# Patient Record
Sex: Female | Born: 1990 | Race: White | Hispanic: No | Marital: Married | Smoking: Current every day smoker
Health system: Southern US, Community
[De-identification: ages and names within clinical notes are randomized; demographics above are authoritative.]

## PROBLEM LIST (undated history)

## (undated) ENCOUNTER — Inpatient Hospital Stay: Payer: Self-pay

## (undated) DIAGNOSIS — Z789 Other specified health status: Secondary | ICD-10-CM

## (undated) DIAGNOSIS — T8859XA Other complications of anesthesia, initial encounter: Secondary | ICD-10-CM

## (undated) DIAGNOSIS — O039 Complete or unspecified spontaneous abortion without complication: Secondary | ICD-10-CM

## (undated) DIAGNOSIS — T4145XA Adverse effect of unspecified anesthetic, initial encounter: Secondary | ICD-10-CM

## (undated) DIAGNOSIS — R519 Headache, unspecified: Secondary | ICD-10-CM

## (undated) DIAGNOSIS — F329 Major depressive disorder, single episode, unspecified: Secondary | ICD-10-CM

## (undated) DIAGNOSIS — F32A Depression, unspecified: Secondary | ICD-10-CM

## (undated) DIAGNOSIS — F419 Anxiety disorder, unspecified: Secondary | ICD-10-CM

## (undated) DIAGNOSIS — E876 Hypokalemia: Secondary | ICD-10-CM

## (undated) DIAGNOSIS — Z9889 Other specified postprocedural states: Secondary | ICD-10-CM

## (undated) DIAGNOSIS — R112 Nausea with vomiting, unspecified: Secondary | ICD-10-CM

## (undated) DIAGNOSIS — R51 Headache: Secondary | ICD-10-CM

## (undated) HISTORY — PX: ABDOMINAL EXPLORATION SURGERY: SHX538

---
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
## 2009-12-18 ENCOUNTER — Ambulatory Visit: Payer: Self-pay | Admitting: Unknown Physician Specialty

## 2009-12-24 ENCOUNTER — Emergency Department (HOSPITAL_COMMUNITY): Admission: EM | Admit: 2009-12-24 | Discharge: 2009-12-24 | Payer: Self-pay | Admitting: Emergency Medicine

## 2009-12-29 ENCOUNTER — Ambulatory Visit: Payer: Self-pay | Admitting: Unknown Physician Specialty

## 2009-12-31 ENCOUNTER — Emergency Department (HOSPITAL_COMMUNITY): Admission: EM | Admit: 2009-12-31 | Discharge: 2009-12-31 | Payer: Self-pay | Admitting: Emergency Medicine

## 2010-06-07 LAB — URINE MICROSCOPIC-ADD ON

## 2010-06-07 LAB — COMPREHENSIVE METABOLIC PANEL
Alkaline Phosphatase: 45 U/L (ref 39–117)
BUN: 9 mg/dL (ref 6–23)
Chloride: 110 mEq/L (ref 96–112)
GFR calc non Af Amer: >60 mL/min (ref >60)
Glucose, Bld: 98 mg/dL (ref 70–99)
Potassium: 3.1 mEq/L — ABNORMAL LOW (ref 3.5–5.1)
Total Bilirubin: 0.4 mg/dL (ref 0.3–1.2)
Total Protein: 5.9 g/dL — ABNORMAL LOW (ref 6.0–8.3)

## 2010-06-07 LAB — DIFFERENTIAL
Basophils Absolute: 0 10*3/uL (ref 0.0–0.1)
Basophils Relative: 1 % (ref 0–1)
Neutro Abs: 3.4 10*3/uL (ref 1.7–7.7)
Neutrophils Relative %: 49 % (ref 43–77)

## 2010-06-07 LAB — CBC
HCT: 35.3 % — ABNORMAL LOW (ref 36.0–46.0)
MCV: 88 fL (ref 78.0–100.0)
RDW: 12.4 % (ref 11.5–15.5)
WBC: 6.9 10*3/uL (ref 4.0–10.5)

## 2010-06-07 LAB — URINALYSIS, ROUTINE W REFLEX MICROSCOPIC
Bilirubin Urine: NEGATIVE
Hgb urine dipstick: NEGATIVE
Nitrite: NEGATIVE
Protein, ur: 30 mg/dL — AB
Specific Gravity, Urine: 1.023 (ref 1.005–1.030)
Urobilinogen, UA: 0.2 mg/dL (ref 0.0–1.0)
Urobilinogen, UA: 0.2 mg/dL (ref 0.0–1.0)

## 2010-06-07 LAB — PREGNANCY, URINE: Preg Test, Ur: NEGATIVE

## 2010-06-07 LAB — LIPASE, BLOOD: Lipase: 21 U/L (ref 11–59)

## 2010-06-07 LAB — POCT PREGNANCY, URINE: Preg Test, Ur: NEGATIVE

## 2010-07-26 ENCOUNTER — Other Ambulatory Visit: Payer: Self-pay | Admitting: Obstetrics and Gynecology

## 2010-10-14 ENCOUNTER — Emergency Department: Payer: Self-pay | Admitting: Unknown Physician Specialty

## 2010-12-18 ENCOUNTER — Observation Stay: Payer: Self-pay

## 2010-12-20 ENCOUNTER — Emergency Department: Payer: Self-pay | Admitting: Emergency Medicine

## 2011-01-21 ENCOUNTER — Observation Stay: Payer: Self-pay | Admitting: Obstetrics and Gynecology

## 2011-02-27 ENCOUNTER — Observation Stay: Payer: Self-pay

## 2011-03-07 ENCOUNTER — Observation Stay: Payer: Self-pay

## 2011-03-22 ENCOUNTER — Observation Stay: Payer: Self-pay

## 2011-03-25 ENCOUNTER — Inpatient Hospital Stay: Payer: Self-pay | Admitting: Obstetrics and Gynecology

## 2011-11-18 ENCOUNTER — Emergency Department: Payer: Self-pay | Admitting: Emergency Medicine

## 2011-11-18 LAB — URINALYSIS, COMPLETE
Bilirubin,UR: NEGATIVE
Glucose,UR: NEGATIVE mg/dL (ref 0–75)
Ketone: NEGATIVE
Ph: 5 (ref 4.5–8.0)
Specific Gravity: 1.03 (ref 1.003–1.030)
Squamous Epithelial: 3

## 2012-01-28 ENCOUNTER — Emergency Department: Payer: Self-pay | Admitting: Emergency Medicine

## 2013-03-24 ENCOUNTER — Emergency Department: Payer: Self-pay | Admitting: Emergency Medicine

## 2013-03-24 LAB — RAPID INFLUENZA A&B ANTIGENS

## 2013-04-19 DIAGNOSIS — F419 Anxiety disorder, unspecified: Secondary | ICD-10-CM | POA: Insufficient documentation

## 2013-04-19 DIAGNOSIS — Z8659 Personal history of other mental and behavioral disorders: Secondary | ICD-10-CM | POA: Insufficient documentation

## 2013-05-04 ENCOUNTER — Ambulatory Visit: Payer: Self-pay | Admitting: Family Medicine

## 2014-07-24 ENCOUNTER — Emergency Department
Admission: EM | Admit: 2014-07-24 | Discharge: 2014-07-24 | Disposition: A | Discharge status: Home / Self Care | Payer: Self-pay | Attending: Emergency Medicine | Admitting: Emergency Medicine

## 2014-07-24 ENCOUNTER — Encounter: Payer: Self-pay | Admitting: Emergency Medicine

## 2014-07-24 DIAGNOSIS — O99330 Smoking (tobacco) complicating pregnancy, unspecified trimester: Secondary | ICD-10-CM | POA: Insufficient documentation

## 2014-07-24 DIAGNOSIS — F17210 Nicotine dependence, cigarettes, uncomplicated: Secondary | ICD-10-CM | POA: Insufficient documentation

## 2014-07-24 DIAGNOSIS — Z3A00 Weeks of gestation of pregnancy not specified: Secondary | ICD-10-CM | POA: Insufficient documentation

## 2014-07-24 DIAGNOSIS — O039 Complete or unspecified spontaneous abortion without complication: Secondary | ICD-10-CM | POA: Insufficient documentation

## 2014-07-24 LAB — CBC
HEMATOCRIT: 42.6 % (ref 35.0–47.0)
HEMOGLOBIN: 14.2 g/dL (ref 12.0–16.0)
MCH: 29.8 pg (ref 26.0–34.0)
MCHC: 33.4 g/dL (ref 32.0–36.0)
MCV: 89.1 fL (ref 80.0–100.0)
Platelets: 197 10*3/uL (ref 150–440)
RBC: 4.77 MIL/uL (ref 3.80–5.20)
RDW: 13.6 % (ref 11.5–14.5)
WBC: 7.7 10*3/uL (ref 3.6–11.0)

## 2014-07-24 LAB — URINALYSIS COMPLETE WITH MICROSCOPIC (ARMC ONLY)
Bacteria, UA: NONE SEEN
Bilirubin Urine: NEGATIVE
Glucose, UA: NEGATIVE mg/dL
KETONES UR: NEGATIVE mg/dL
Leukocytes, UA: NEGATIVE
NITRITE: NEGATIVE
PH: 6 (ref 5.0–8.0)
PROTEIN: 30 mg/dL — AB
Specific Gravity, Urine: 1.02 (ref 1.005–1.030)

## 2014-07-24 LAB — HCG, QUANTITATIVE, PREGNANCY: HCG, BETA CHAIN, QUANT, S: 5 m[IU]/mL — AB (ref <5)

## 2014-07-24 NOTE — ED Provider Notes (Signed)
[facility] Emergency Department Provider Note    ____________________________________________  Time seen: 11:40 AM  I have reviewed the triage vital signs and the nursing notes.   HISTORY  Chief Complaint Vaginal Bleeding       HPI Erin Ellis is a 24 y.o. female who presents with vaginal bleeding 12 hours. She noted small amount of blood after urinating yesterday evening. This morning there was more bleeding. She did take a Percocet test which was +1 week ago. Patient is a G4 P2. Does note mild pelvic cramping. Has not taken any medications for this. Nothing appears to make it better or worse. She reports a short period at the end of April and her last normal period was early March.     History reviewed. No pertinent past medical history.  There are no active problems to display for this patient.   Past Surgical History  Procedure Laterality Date  . Abdominal exploration surgery      No current outpatient prescriptions on file.  Allergies Sulfa antibiotics  No family history on file.  Social History History  Substance Use Topics  . Smoking status: Current Every Day Smoker -- 1.00 packs/day    Types: Cigarettes  . Smokeless tobacco: Not on file  . Alcohol Use: Yes    Review of Systems  Constitutional: Negative for fever. Eyes: Negative for visual changes. ENT: Negative for sore throat. Cardiovascular: Negative for chest pain. Respiratory: Negative for shortness of breath. Gastrointestinal: Negative for abdominal pain, vomiting and diarrhea. Genitourinary: Negative for dysuria. Positive for vaginal bleeding and pelvic cramping Musculoskeletal: Negative for back pain. Skin: Negative for rash. Neurological: Negative for headaches, focal weakness or numbness.   10-point ROS otherwise negative.  ____________________________________________   PHYSICAL EXAM:  VITAL SIGNS: ED Triage Vitals  Enc Vitals Group     BP  07/24/14 1123 119/69 mmHg     Pulse Rate 07/24/14 1123 86     Resp 07/24/14 1123 14     Temp 07/24/14 1123 98 F (36.7 C)     Temp Source 07/24/14 1123 Oral     SpO2 07/24/14 1123 100 %     Weight 07/24/14 1123 125 lb (56.7 kg)     Height 07/24/14 1123 5\' 3"  (1.6 m)     Head Cir --      Peak Flow --      Pain Score 07/24/14 1123 9     Pain Loc --      Pain Edu? --      Excl. in GC? --      Constitutional: Alert and oriented. Well appearing and in no distress. Eyes: Conjunctivae are normal. PERRL. Normal extraocular movements. ENT   Head: Normocephalic and atraumatic.   Nose: No congestion/rhinnorhea.   Mouth/Throat: Mucous membranes are moist.   Neck: No stridor. Hematological/Lymphatic/Immunilogical: No cervical lymphadenopathy. Cardiovascular: Normal rate, regular rhythm. Normal and symmetric distal pulses are present in all extremities. No murmurs, rubs, or gallops. Respiratory: Normal respiratory effort without tachypnea nor retractions. Breath sounds are clear and equal bilaterally. No wheezes/rales/rhonchi. Gastrointestinal: Patient does have mild right lower quadrant/pelvic tenderness to palpation. No distention. No abdominal bruits. There is no CVA tenderness. Genitourinary: Deferred Musculoskeletal: Nontender with normal range of motion in all extremities. No joint effusions.  No lower extremity tenderness nor edema. Neurologic:  Normal speech and language. No gross focal neurologic deficits are appreciated. Speech is normal. No gait instability. Skin:  Skin is warm, dry and intact. No rash noted.  Psychiatric: Mood and affect are normal. Speech and behavior are normal. Patient exhibits appropriate insight and judgment.  ____________________________________________   EKG  None  ____________________________________________    RADIOLOGY  None  ____________________________________________   PROCEDURES  Procedure(s) performed: None  Critical  Care performed: No  ____________________________________________   INITIAL IMPRESSION / ASSESSMENT AND PLAN / ED COURSE  Pertinent labs & imaging results that were available during my care of the patient were reviewed by me and considered in my medical decision making (see chart for details).  ----------------------------------------- 11:48 AM on 07/24/2014 -----------------------------------------  Patient reports positive home pregnancy test now with vaginal bleeding she is concerned about a miscarriage. Labs beta hCG sent. Depending on labs plan is for likely ultrasound.  Beta hCG 5. Not consistent with pregnancy. Explained to patient results asked her to follow-up with GYN. On reexam no tenderness to palpation of the abdomen  ____________________________________________   FINAL CLINICAL IMPRESSION(S) / ED DIAGNOSES  Final diagnoses:  Miscarriage     Jene Every, MD 07/25/14 [phone]

## 2014-07-24 NOTE — Discharge Instructions (Signed)
Miscarriage °A miscarriage is the loss of an unborn baby (fetus) before the 20th week of pregnancy. The cause is often unknown.  °HOME CARE °· You may need to stay in bed (bed rest), or you may be able to do light activity. Go about activity as told by your doctor. °· Have help at home. °· Write down how many pads you use each day. Write down how soaked they are. °· Do not use tampons. Do not wash out your vagina (douche) or have sex (intercourse) until your doctor approves. °· Only take medicine as told by your doctor. °· Do not take aspirin. °· Keep all doctor visits as told. °· If you or your partner have problems with grieving, talk to your doctor. You can also try counseling. Give yourself time to grieve before trying to get pregnant again. °GET HELP RIGHT AWAY IF: °· You have bad cramps or pain in your back or belly (abdomen). °· You have a fever. °· You pass large clumps of blood (clots) from your vagina that are walnut-sized or larger. Save the clumps for your doctor to see. °· You pass large amounts of tissue from your vagina. Save the tissue for your doctor to see. °· You have more bleeding. °· You have thick, bad-smelling fluid (discharge) coming from the vagina. °· You get lightheaded, weak, or you pass out (faint). °· You have chills. °MAKE SURE YOU: °· Understand these instructions. °· Will watch your condition. °· Will get help right away if you are not doing well or get worse. °Document Released: 06/03/2011 Document Reviewed: 06/03/2011 °[employer]® Patient Information ©2015 [employer]. This information is not intended to replace advice given to you by your health care provider. Make sure you discuss any questions you have with your health care provider. ° °

## 2014-07-24 NOTE — ED Notes (Signed)
Patient states that she had a positive pregnancy test on the 28th. Patient states that her [facility] was April but it was shorter then normal. March menstrual cycle was normal. Report vaginal bleeding and abdominal cramping since last night.

## 2014-11-01 DIAGNOSIS — F419 Anxiety disorder, unspecified: Secondary | ICD-10-CM

## 2014-11-01 DIAGNOSIS — Z720 Tobacco use: Secondary | ICD-10-CM | POA: Insufficient documentation

## 2014-11-01 DIAGNOSIS — F329 Major depressive disorder, single episode, unspecified: Secondary | ICD-10-CM | POA: Insufficient documentation

## 2014-11-01 DIAGNOSIS — J45909 Unspecified asthma, uncomplicated: Secondary | ICD-10-CM | POA: Insufficient documentation

## 2014-11-01 DIAGNOSIS — F32A Depression, unspecified: Secondary | ICD-10-CM | POA: Insufficient documentation

## 2014-11-01 DIAGNOSIS — Z3491 Encounter for supervision of normal pregnancy, unspecified, first trimester: Secondary | ICD-10-CM | POA: Insufficient documentation

## 2014-11-05 ENCOUNTER — Emergency Department: Payer: Medicaid Other

## 2014-11-05 ENCOUNTER — Emergency Department
Admission: EM | Admit: 2014-11-05 | Discharge: 2014-11-05 | Disposition: A | Discharge status: Home / Self Care | Payer: Medicaid Other | Attending: Emergency Medicine | Admitting: Emergency Medicine

## 2014-11-05 ENCOUNTER — Encounter: Payer: Self-pay | Admitting: Emergency Medicine

## 2014-11-05 DIAGNOSIS — O209 Hemorrhage in early pregnancy, unspecified: Secondary | ICD-10-CM | POA: Insufficient documentation

## 2014-11-05 DIAGNOSIS — O99331 Smoking (tobacco) complicating pregnancy, first trimester: Secondary | ICD-10-CM | POA: Diagnosis not present

## 2014-11-05 DIAGNOSIS — Z79899 Other long term (current) drug therapy: Secondary | ICD-10-CM | POA: Insufficient documentation

## 2014-11-05 DIAGNOSIS — F17210 Nicotine dependence, cigarettes, uncomplicated: Secondary | ICD-10-CM | POA: Diagnosis not present

## 2014-11-05 DIAGNOSIS — Z3A08 8 weeks gestation of pregnancy: Secondary | ICD-10-CM | POA: Insufficient documentation

## 2014-11-05 HISTORY — DX: Complete or unspecified spontaneous abortion without complication: O03.9

## 2014-11-05 LAB — ABO/RH: ABO/RH(D): A POS

## 2014-11-05 LAB — CBC
HEMATOCRIT: 40.8 % (ref 35.0–47.0)
Hemoglobin: 13.8 g/dL (ref 12.0–16.0)
MCH: 30.3 pg (ref 26.0–34.0)
MCHC: 33.8 g/dL (ref 32.0–36.0)
MCV: 89.6 fL (ref 80.0–100.0)
PLATELETS: 206 10*3/uL (ref 150–440)
RBC: 4.55 MIL/uL (ref 3.80–5.20)
RDW: 13.7 % (ref 11.5–14.5)
WBC: 7 10*3/uL (ref 3.6–11.0)

## 2014-11-05 LAB — HCG, QUANTITATIVE, PREGNANCY: hCG, Beta Chain, Quant, S: 9886 m[IU]/mL — ABNORMAL HIGH (ref <5)

## 2014-11-05 NOTE — ED Provider Notes (Signed)
[facility] Emergency Department Provider Note  ____________________________________________  Time seen: Approximately 2:54 PM  I have reviewed the triage vital signs and the nursing notes.   HISTORY  Chief Complaint Vaginal Bleeding    HPI Erin Ellis is a 24 y.o. female G5 to previous full-term deliveries, one miscarriage and May, and 1 abortion last year. Presents today with approximately [redacted] weeks pregnant per her report and follow-up with the health department. She states that this morning she got up and noticed that she was having a slight amount of painless vaginal bleeding. She denies any pain or other symptoms. No fevers or chills. She states that she has a pad on down is having a slight amount of discharge. She is concerned that she may be having another miscarriage.  She does not use any fertility treatments. She feels well. No previous history of ectopic. This is an intended pregnancy.   Past Medical History  Diagnosis Date  . Miscarriage   . Abortion     In 2015 (8 weeks, 4 days pregnant)    There are no active problems to display for this patient.   Past Surgical History  Procedure Laterality Date  . Abdominal exploration surgery      Current Outpatient Rx  Name  Route  Sig  Dispense  Refill  . Prenatal Vit-Fe Fumarate-FA (PRENATAL MULTIVITAMIN) TABS tablet   Oral   Take 1 tablet by mouth daily at 12 noon.           Allergies Sulfa antibiotics  No family history on file.  Social History Social History  Substance Use Topics  . Smoking status: Current Every Day Smoker -- 1.00 packs/day    Types: Cigarettes  . Smokeless tobacco: None  . Alcohol Use: No    Review of Systems Constitutional: No fever/chills Eyes: No visual changes. ENT: No sore throat. Cardiovascular: Denies chest pain. Respiratory: Denies shortness of breath. Gastrointestinal: No abdominal pain.  No nausea, no vomiting.  No diarrhea.  No  constipation. Genitourinary: Negative for dysuria. Slight vaginal bleeding today. No passage of clots. No other discharge. No vaginal pain, no white discharge. Musculoskeletal: Negative for back pain. Skin: Negative for rash. Neurological: Negative for headaches, focal weakness or numbness.  10-point ROS otherwise negative.  ____________________________________________   PHYSICAL EXAM:  VITAL SIGNS: ED Triage Vitals  Enc Vitals Group     BP 11/05/14 1312 119/73 mmHg     Pulse Rate 11/05/14 1312 81     Resp 11/05/14 1312 18     Temp 11/05/14 1312 97.8 F (36.6 C)     Temp src --      SpO2 11/05/14 1312 98 %     Weight 11/05/14 1312 125 lb (56.7 kg)     Height 11/05/14 1312  (1.6 m)     Head Cir --      Peak Flow --      Pain Score --      Pain Loc --      Pain Edu? --      Excl. in GC? --     Constitutional: Alert and oriented. Well appearing and in no acute distress. Eyes: Conjunctivae are normal. PERRL. EOMI. Head: Atraumatic. Nose: No congestion/rhinnorhea. Mouth/Throat: Mucous membranes are moist.  Oropharynx non-erythematous. Neck: No stridor.   Cardiovascular: Normal rate, regular rhythm. Grossly normal heart sounds.  Good peripheral circulation. Respiratory: Normal respiratory effort.  No retractions. Lungs CTAB. Gastrointestinal: Soft and nontender. No distention. No abdominal bruits.  No CVA tenderness. No tenderness in the lower abdomen. Musculoskeletal: No lower extremity tenderness nor edema.  No joint effusions. Neurologic:  Normal speech and language. No gross focal neurologic deficits are appreciated. No gait instability. Skin:  Skin is warm, dry and intact. No rash noted. Psychiatric: Mood and affect are normal. Speech and behavior are normal.  ____________________________________________   LABS (all labs ordered are listed, but only abnormal results are displayed)  Labs Reviewed  HCG, QUANTITATIVE, PREGNANCY - Abnormal; Notable for the  following:    hCG, Beta Chain, Quant, S 9886 (*)    All other components within normal limits  CBC  ABO/RH   ____________________________________________  EKG   ____________________________________________  RADIOLOGY  IMPRESSION: IUP with yolk sac and fetal pole, but no cardiac activity yet visualized.  Findings are suspicious but not yet definitive for failed pregnancy. Recommend follow-up US in 10-14 days for definitive diagnosis. This recommendation follows SRU consensus guidelines: Diagnostic Criteria for Nonviable Pregnancy Early in the First Trimester. Malva Limes Med 2013; 369:1443-51. ____________________________________________   PROCEDURES  Procedure(s) performed: None  Critical Care performed: No  ____________________________________________   INITIAL IMPRESSION / ASSESSMENT AND PLAN / ED COURSE  Pertinent labs & imaging results that were available during my care of the patient were reviewed by me and considered in my medical decision making (see chart for details).  Painless vaginal bleeding during the first trimester. Patient report. Vital signs stable no distress. Based on her clinical presentation of very unlikely she has neck top pregnancy, given her history of previous miscarriage and today's bleeding which does not appear to be significant/major we will obtain ultrasound as well as type and screen with CBC. Rule out ectopic and eval for 1st trimester bleeding.  ----------------------------------------- 6:11 PM on 11/05/2014 -----------------------------------------  Patient is well in no pain. I discussed her ultrasound findings and they we are unable to know at this time whether or not she is caring a healthy pregnancy. She is understanding of this, and she has set up follow-up with [facility] obstetrics already this month, but will follow-up this week. Reviewed history, U/S and follow-up recs for this week with Dr. Arvella Merles of OB who is agreeabel to  see patient in f/u in clinic this week.  Return precautions advised. ____________________________________________   FINAL CLINICAL IMPRESSION(S) / ED DIAGNOSES  Final diagnoses:  Vaginal bleeding in pregnancy, first trimester      Sharyn Creamer, MD 11/05/14 [phone]

## 2014-11-05 NOTE — Discharge Instructions (Signed)
Vaginal Bleeding During Pregnancy, First Trimester  Please follow-up with [facility] obstetrics this week. They will be able to work with you to assist with financial aid as we discussed. Please return to the emergency room right away should you develop severe pain, heavy bleeding, weakness, feel lightheaded, develop a fever, or other new concerns arise.  A small amount of bleeding (spotting) from the vagina is relatively common in early pregnancy. It usually stops on its own. Various things may cause bleeding or spotting in early pregnancy. Some bleeding may be related to the pregnancy, and some may not. In most cases, the bleeding is normal and is not a problem. However, bleeding can also be a sign of something serious. Be sure to tell your health care provider about any vaginal bleeding right away. Some possible causes of vaginal bleeding during the first trimester include:  Infection or inflammation of the cervix.  Growths (polyps) on the cervix.  Miscarriage or threatened miscarriage.  Pregnancy tissue has developed outside of the uterus and in a fallopian tube (tubal pregnancy).  Tiny cysts have developed in the uterus instead of pregnancy tissue (molar pregnancy). HOME CARE INSTRUCTIONS  Watch your condition for any changes. The following actions may help to lessen any discomfort you are feeling:  Follow your health care provider's instructions for limiting your activity. If your health care provider orders bed rest, you may need to stay in bed and only get up to use the bathroom. However, your health care provider may allow you to continue light activity.  If needed, make plans for someone to help with your regular activities and responsibilities while you are on bed rest.  Keep track of the number of pads you use each day, how often you change pads, and how soaked (saturated) they are. Write this down.  Do not use tampons. Do not douche.  Do not have sexual intercourse or orgasms  until approved by your health care provider.  If you pass any tissue from your vagina, save the tissue so you can show it to your health care provider.  Only take over-the-counter or prescription medicines as directed by your health care provider.  Do not take aspirin because it can make you bleed.  Keep all follow-up appointments as directed by your health care provider. SEEK MEDICAL CARE IF:  You have any vaginal bleeding during any part of your pregnancy.  You have cramps or labor pains.  You have a fever, not controlled by medicine. SEEK IMMEDIATE MEDICAL CARE IF:   You have severe cramps in your back or belly (abdomen).  You pass large clots or tissue from your vagina.  Your bleeding increases.  You feel light-headed or weak, or you have fainting episodes.  You have chills.  You are leaking fluid or have a gush of fluid from your vagina.  You pass out while having a bowel movement. MAKE SURE YOU:  Understand these instructions.  Will watch your condition.  Will get help right away if you are not doing well or get worse. Document Released: 12/19/2004 Document Revised: 03/16/2013 Document Reviewed: 11/16/2012 [facility] Patient Information 2015 [place], [place]. This information is not intended to replace advice given to you by your health care provider. Make sure you discuss any questions you have with your health care provider.

## 2014-11-05 NOTE — ED Notes (Addendum)
Patient to ER for c/o vaginal bleeding that started today. Patient states she had red blood, but not bright red. Moderate amount of bleeding. Had miscarriage in April. Last period was in June, but has irregular periods. Scheduled at [facility] for dating ultrasound on 11/17/14 Deatra Robinson, midwife is who patient is scheduled to see).

## 2014-11-05 NOTE — ED Notes (Signed)
MD aware of pain rating.

## 2015-02-23 ENCOUNTER — Emergency Department
Admission: EM | Admit: 2015-02-23 | Discharge: 2015-02-23 | Disposition: A | Discharge status: Home / Self Care | Payer: Medicaid Other | Attending: Emergency Medicine | Admitting: Emergency Medicine

## 2015-02-23 DIAGNOSIS — N939 Abnormal uterine and vaginal bleeding, unspecified: Secondary | ICD-10-CM | POA: Diagnosis not present

## 2015-02-23 DIAGNOSIS — Z3202 Encounter for pregnancy test, result negative: Secondary | ICD-10-CM | POA: Insufficient documentation

## 2015-02-23 DIAGNOSIS — Z79899 Other long term (current) drug therapy: Secondary | ICD-10-CM | POA: Diagnosis not present

## 2015-02-23 DIAGNOSIS — F17210 Nicotine dependence, cigarettes, uncomplicated: Secondary | ICD-10-CM | POA: Diagnosis not present

## 2015-02-23 LAB — URINALYSIS COMPLETE WITH MICROSCOPIC (ARMC ONLY)
BACTERIA UA: NONE SEEN
Bilirubin Urine: NEGATIVE
Glucose, UA: NEGATIVE mg/dL
LEUKOCYTES UA: NEGATIVE
NITRITE: NEGATIVE
PH: 6 (ref 5.0–8.0)
Protein, ur: NEGATIVE mg/dL
SPECIFIC GRAVITY, URINE: 1.027 (ref 1.005–1.030)

## 2015-02-23 LAB — BASIC METABOLIC PANEL
ANION GAP: 6 (ref 5–15)
BUN: 11 mg/dL (ref 6–20)
CHLORIDE: 107 mmol/L (ref 101–111)
CO2: 27 mmol/L (ref 22–32)
Calcium: 9.2 mg/dL (ref 8.9–10.3)
Creatinine, Ser: 0.61 mg/dL (ref 0.44–1.00)
GFR calc non Af Amer: >60 mL/min (ref >60)
GLUCOSE: 105 mg/dL — AB (ref 65–99)
POTASSIUM: 3.5 mmol/L (ref 3.5–5.1)
Sodium: 140 mmol/L (ref 135–145)

## 2015-02-23 LAB — CBC
HEMATOCRIT: 40.3 % (ref 35.0–47.0)
HEMOGLOBIN: 13.8 g/dL (ref 12.0–16.0)
MCH: 30.6 pg (ref 26.0–34.0)
MCHC: 34.2 g/dL (ref 32.0–36.0)
MCV: 89.4 fL (ref 80.0–100.0)
Platelets: 223 10*3/uL (ref 150–440)
RBC: 4.5 MIL/uL (ref 3.80–5.20)
RDW: 13.7 % (ref 11.5–14.5)
WBC: 8.9 10*3/uL (ref 3.6–11.0)

## 2015-02-23 LAB — HCG, QUANTITATIVE, PREGNANCY: hCG, Beta Chain, Quant, S: 1 m[IU]/mL (ref <5)

## 2015-02-23 NOTE — ED Notes (Signed)
Pt in with vaginal bleeding for 4 days, had positive pregnancy test last week.  States 5 miscarriages in the last year.

## 2015-02-23 NOTE — Discharge Instructions (Signed)
As we discussed, does not appear from your blood test that you're pregnant today but no previous home pregnancy tests are questionably positive.  I advise you to follow up closely with [facility] doctors within the next 1-2 weeks for follow-up. Return to the emergency room right away if he develop a fever, pain, heavy bleeding, lightheadedness, or other new concerns arise.

## 2015-02-23 NOTE — ED Provider Notes (Signed)
[facility] Emergency Department Provider Note REMINDER - THIS NOTE IS NOT A FINAL MEDICAL RECORD UNTIL IT IS SIGNED. UNTIL THEN, THE CONTENT BELOW MAY REFLECT INFORMATION FROM A DOCUMENTATION TEMPLATE, NOT THE ACTUAL PATIENT VISIT. ____________________________________________  Time seen: Approximately 10:11 PM  I have reviewed the triage vital signs and the nursing notes.   HISTORY  Chief Complaint Vaginal Bleeding    HPI Erin Ellis is a 24 y.o. female comes for evaluation of vaginal bleeding which she describes as spotting like a period for the last 4 days. She reports that in the last year she has had about 5 miscarriages, she also had a previous abortion about 3 years ago, as well as has 2 healthy children  She reports that she has not had any pain, fever, nausea vomiting or other concerns. She took a home pregnancy test about 2 weeks ago and demonstrates via picture what appears to be very light and potentially positive home pregnancy test.  Patient tells me that she feels as though she started to have her period, that she would like further evaluation to see why she keeps having positive pregnancy test.   Past Medical History  Diagnosis Date  . Miscarriage   . Abortion     In 2015 (8 weeks, 4 days pregnant)    There are no active problems to display for this patient.   Past Surgical History  Procedure Laterality Date  . Abdominal exploration surgery      Current Outpatient Rx  Name  Route  Sig  Dispense  Refill  . Prenatal Vit-Fe Fumarate-FA (PRENATAL MULTIVITAMIN) TABS tablet   Oral   Take 1 tablet by mouth daily at 12 noon.           Allergies Sulfa antibiotics  No family history on file.  Social History Social History  Substance Use Topics  . Smoking status: Current Every Day Smoker -- 1.00 packs/day    Types: Cigarettes  . Smokeless tobacco: Not on file  . Alcohol Use: No    Review of Systems Constitutional: No  fever/chills Eyes: No visual changes. ENT: No sore throat. Cardiovascular: Denies chest pain. Respiratory: Denies shortness of breath. Gastrointestinal: No abdominal pain.  No nausea, no vomiting.  No diarrhea.  No constipation. Genitourinary: Negative for dysuria. Musculoskeletal: Negative for back pain. Skin: Negative for rash. Neurological: Negative for headaches, focal weakness or numbness.  10-point ROS otherwise negative.  ____________________________________________   PHYSICAL EXAM:  VITAL SIGNS: ED Triage Vitals  Enc Vitals Group     BP 02/23/15 2025 121/71 mmHg     Pulse Rate 02/23/15 2025 80     Resp 02/23/15 2025 18     Temp 02/23/15 2025 98.3 F (36.8 C)     Temp Source 02/23/15 2025 Oral     SpO2 02/23/15 2025 99 %     Weight 02/23/15 2025 130 lb (58.968 kg)     Height 02/23/15 2025  (1.6 m)     Head Cir --      Peak Flow --      Pain Score --      Pain Loc --      Pain Edu? --      Excl. in GC? --    Constitutional: Alert and oriented. Well appearing and in no acute distress. Eyes: Conjunctivae are normal. PERRL. EOMI. Head: Atraumatic. Nose: No congestion/rhinnorhea. Mouth/Throat: Mucous membranes are moist.  Oropharynx non-erythematous. Neck: No stridor.   Cardiovascular: Normal rate, regular  rhythm. Grossly normal heart sounds.  Good peripheral circulation. Respiratory: Normal respiratory effort.  No retractions. Lungs CTAB. Gastrointestinal: Soft and nontender. No distention. No abdominal bruits. No CVA tenderness. Musculoskeletal: No lower extremity tenderness nor edema.  No joint effusions. Neurologic:  Normal speech and language. No gross focal neurologic deficits are appreciated. No gait instability. Skin:  Skin is warm, dry and intact. No rash noted. Psychiatric: Mood and affect are normal. Speech and behavior are normal.  ____________________________________________   LABS (all labs ordered are listed, but only abnormal results are  displayed)  Labs Reviewed  BASIC METABOLIC PANEL - Abnormal; Notable for the following:    Glucose, Bld 105 (*)    All other components within normal limits  URINALYSIS COMPLETEWITH MICROSCOPIC ([facility] ONLY) - Abnormal; Notable for the following:    Color, Urine YELLOW (*)    APPearance CLEAR (*)    Ketones, ur TRACE (*)    Hgb urine dipstick 1+ (*)    Squamous Epithelial / LPF 0-5 (*)    All other components within normal limits  HCG, QUANTITATIVE, PREGNANCY  CBC   ____________________________________________  EKG   ____________________________________________  RADIOLOGY   ____________________________________________   PROCEDURES  Procedure(s) performed: None  Critical Care performed: No  ____________________________________________   INITIAL IMPRESSION / ASSESSMENT AND PLAN / ED COURSE  Pertinent labs & imaging results that were available during my care of the patient were reviewed by me and considered in my medical decision making (see chart for details).  Patient presents for evaluation of vaginal bleeding, poorly consistent with approximately a normal period. The present time, she is very reassuring exam with no pain. She does have a negative hCG of less than 5. This would appear to be inconsistent with an obvious pregnancy.  She is stable, no acute concerns. Her last menstrual. She reported to me was October 30, and I question if this may just be a normal cycle. She does demonstrate what look like it questionably positive home pregnancy test, though pretty unclear and is certainly not clearly diagnostic from 2 weeks ago.  We discussed and I advised and gave her referral for follow-up with OB/GYN. She is agreeable with this plan, and plans to establish care with them within the next 1 week. Return precautions advised. ____________________________________________   FINAL CLINICAL IMPRESSION(S) / ED DIAGNOSES  Final diagnoses:  Vaginal spotting      Sharyn Creamer, MD 02/23/15 2314

## 2015-03-26 NOTE — L&D Delivery Note (Signed)
Delivery Note  First Stage: Induction of labor: Cervidil placed on 12/05/15 @ 2130 Labor onset: 12/06/15: 1227 Augmentation : AROM, Pitocin Analgesia Eliezer Lofts intrapartum: Epidural  AROM at 1227 - clear fluid  Second Stage: Complete dilation at 1648 Onset of pushing at 1648 FHR second stage: Baseline: 145 bpm/ moderate variability/ +accels/ no decels    Delivery of a viable Female "Belle" at 1701 by Carlean Jews, CNM in [facility] position with compound hand presentation No nuchal cord Cord double clamped after cessation of pulsation, cut by FOB Cord blood sample N/A  Third Stage: Placenta delivered via Tomasa Blase intact with 3 VC @ 1709 Placenta disposition: hospital disposal  Uterine tone firm with massage / bleeding minimal  No lacerations identified  Est. Blood Loss (mL):  Complications: none  Mom to postpartum.  Baby to Couplet care / Skin to Skin.  Newborn: Birth Weight: 2960 grams (6#8.4oz) Apgar Scores: 8, 9 Feeding planned: Breast  Carlean Jews, CNM

## 2015-04-17 ENCOUNTER — Ambulatory Visit
Admission: EM | Admit: 2015-04-17 | Discharge: 2015-04-17 | Disposition: A | Discharge status: Home / Self Care | Payer: Self-pay | Attending: Family Medicine | Admitting: Family Medicine

## 2015-04-17 DIAGNOSIS — Z3201 Encounter for pregnancy test, result positive: Secondary | ICD-10-CM

## 2015-04-17 DIAGNOSIS — L03213 Periorbital cellulitis: Secondary | ICD-10-CM

## 2015-04-17 DIAGNOSIS — Z3401 Encounter for supervision of normal first pregnancy, first trimester: Secondary | ICD-10-CM

## 2015-04-17 LAB — PREGNANCY, URINE: PREG TEST UR: POSITIVE — AB

## 2015-04-17 NOTE — ED Provider Notes (Signed)
[facility]  ____________________________________________  Time seen: Approximately 1:29 PM  I have reviewed the triage vital signs and the nursing notes.   HISTORY  Chief Complaint Eye Problem   HPI Erin Ellis is a 25 y.o. female   presents for the complaints of redness and swelling around left eye. Patient reports that she noticed a small what appeared to be a knot beside her left eyebrow this past week and states that she's had gradual onset of redness. Patient denies drainage from the knot but reports that knot resolved and now has a scabbed area in that same area. She reports that the eye itself does not hurt but reports that she has pain with looking to her left, looking down as well as mild pain looking up and looking to her right. Denies trauma foreign body sensation. Denies sick contacts. Denies injury, bites or break in skin. Denies chemical exposure. Patient reports some light sensitivity to left eye today. Denies vision changes. Reports that she does not wear glasses or contacts on a regular basis but reports that she knows she needs glasses. States left eye was more swollen this am than current.   Patient also reports that she is 3 days late for her menstrual cycle. Patient states that she has been trying to become pregnant. Denies abdominal pain, vaginal bleeding, vaginal discharge, back pain, nausea, vomiting, dizziness or other complaints. Patient reports last menstrual December 25. Reports OB/GYN is [facility].   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    Past Medical History  Diagnosis Date  . Miscarriage   . Abortion     In 2015 (8 weeks, 4 days pregnant)    There are no active problems to display for this patient.   Past Surgical History  Procedure Laterality Date  . Abdominal exploration surgery      Current Outpatient Rx  Name  Route  Sig  Dispense  Refill  .  Prenatal Vit-Fe Fumarate-FA (PRENATAL MULTIVITAMIN) TABS tablet   Oral   Take 1 tablet by mouth daily at 12 noon.           Allergies Sulfa antibiotics  Family History  Problem Relation Age of Onset  . Hypertension Mother     Social History Social History  Substance Use Topics  . Smoking status: Current Every Day Smoker -- 1.00 packs/day    Types: Cigarettes  . Smokeless tobacco: None  . Alcohol Use: No    Review of Systems Constitutional: No fever/chills Eyes: No visual changes. Redness around left eye.  ENT: No sore throat. Cardiovascular: Denies chest pain. Respiratory: Denies shortness of breath. Gastrointestinal: No abdominal pain.  No nausea, no vomiting.  No diarrhea.  No constipation. Genitourinary: Negative for dysuria. Musculoskeletal: Negative for back pain. Skin: Negative for rash. Neurological: Negative for headaches, focal weakness or numbness.  10-point ROS otherwise negative.  ____________________________________________   PHYSICAL EXAM:  VITAL SIGNS: ED Triage Vitals  Enc Vitals Group     BP 04/17/15 1258 109/78 mmHg     Pulse Rate 04/17/15 1258 78     Resp 04/17/15 1258 16     Temp 04/17/15 1258 98.2 F (36.8 C)     Temp Source 04/17/15 1258 Tympanic     SpO2 04/17/15 1258 99 %     Weight 04/17/15 1258 130 lb (58.968 kg)     Height 04/17/15 1258 5\' 3"  (1.6 m)  Head Cir --      Peak Flow --      Pain Score 04/17/15 1300 6     Pain Loc --      Pain Edu? --      Excl. in GC? --    Visual Acuity LW   Visual Acuity - R Distance: 20/200 uncorrected ; L Distance: 20/70 uncorrected     Constitutional: Alert and oriented. Well appearing and in no acute distress. Eyes: Conjunctivae are normal. PERRL. EOMI. mild to moderate erythema surrounding left eye, increased to the superior left lateral area with an area of approximately half a centimeter in diameter of appearances excoriation that is approximately 3 cm lateral to left eye, no  induration, mild edema to left eyelid and surrounding areas, EOMs intact but pain present with EOMs, painful EOMs to left lateral and looking down, mild pain with looking up and right. No ophthalmoplegia. Left lateral eye and superior orbit mild to mod TTP, no induration, no fluctuance. Face otherwise nontender and no erythema or edema. Head: Atraumatic.  Ears: no erythema, normal TMs bilaterally.   Nose: No congestion/rhinnorhea.  Mouth/Throat: Mucous membranes are moist.  Oropharynx non-erythematous. Neck: No stridor.  No cervical spine tenderness to palpation. Hematological/Lymphatic/Immunilogical: No cervical lymphadenopathy. Cardiovascular: Normal rate, regular rhythm. Grossly normal heart sounds.  Good peripheral circulation. Respiratory: Normal respiratory effort.  No retractions. Lungs CTAB. Gastrointestinal: Soft and nontender. Normal Bowel sounds. No CVA tenderness. Musculoskeletal: No lower or upper extremity tenderness nor edema. Bilateral pedal pulses equal and easily palpated.  Neurologic:  Normal speech and language. No gross focal neurologic deficits are appreciated. No gait instability. Skin:  Skin is warm, dry and intact. No rash noted. Psychiatric: Mood and affect are normal. Speech and behavior are normal.  ____________________________________________   LABS (all labs ordered are listed, but only abnormal results are displayed)  Labs Reviewed  PREGNANCY, URINE - Abnormal; Notable for the following:    Preg Test, Ur POSITIVE (*)    All other components within normal limits     INITIAL IMPRESSION / ASSESSMENT AND PLAN / ED COURSE  Pertinent labs & imaging results that were available during my care of the patient were reviewed by me and considered in my medical decision making (see chart for details).  Very well appearing patient. No acute distress.   Patient presents for the complaint of redness surrounding left eye. Patient reports that this is been gradual onset  over 6 days. Patient denies pain to eye itself however reports pain with looking different directions. Patient with noted mild to moderate erythema surrounding left eye, increased to the superior left lateral area with an area of approximately half a centimeter in diameter of appearances excoriation that is approximately 3 cm lateral to left eye lateral canthus, no induration, mild edema to left eyelid and surrounding areas, EOMs intact but pain present with EOMs. Suspect periorbital cellulitis, however as pain with EOMs concern for orbital cellulitis. Will consult to ophthalmology, and try to have ophthalmology evaluate patient in office as soon as possible. Patient also reports that she is a few days late for her menstrual, urine pregnancy test is positive. Discussed in detail with patient regarding pregnancy test patient states that she will begin prenatal vitamins and call to follow-up with her OB/GYN at [facility]. Counseled regarding smoking cessations as well as no use of otc medications not recommended in pregnancy.  1355: ophthalmology on call paged.   1420: No return call from ophthalmology, paged again. Also  called [place] ophthalmology office. Spoke with Liz Malady for Dr Inez Pilgrim who relayed information to Dr Inez Pilgrim, and Dr Inez Pilgrim will see patient in office today to further evaluate. Patient stable at the time of discharge and reports that she will go directly to [facility] ophthalmology for further evaluation as discussed and directed.    Discussed follow up with Primary care physician this week. Discussed follow up and return parameters including no resolution or any worsening concerns. Patient verbalized understanding and agreed to plan.   ____________________________________________   FINAL CLINICAL IMPRESSION(S) / ED DIAGNOSES  Final diagnoses:  Periorbital cellulitis of left eye  Pregnancy, first, first trimester      Note: This dictation was prepared with  Dragon dictation along with smaller phrase technology. Any transcriptional errors that result from this process are unintentional.    Renford Dills, NP 04/17/15 1534

## 2015-04-17 NOTE — Discharge Instructions (Signed)
As discussed, go directly to the ophthalmologist's office. See above. They will work you in to be seen today.   Follow up with your OBGYN this week. Do not take any medications contraindicated to pregnancy. Take prenatal vitamins.   Return to Urgent care or go to ER for vision changes, increased redness or swelling, abdominal pain, vaginal bleeding, new or worsening concerns.   First Trimester of Pregnancy The first trimester of pregnancy is from week 1 until the end of week 12 (months 1 through 3). A week after a sperm fertilizes an egg, the egg will implant on the wall of the uterus. This embryo will begin to develop into a baby. Genes from you and your partner are forming the baby. The female genes determine whether the baby is a boy or a girl. At 6-8 weeks, the eyes and face are formed, and the heartbeat can be seen on ultrasound. At the end of 12 weeks, all the baby's organs are formed.  Now that you are pregnant, you will want to do everything you can to have a healthy baby. Two of the most important things are to get good prenatal care and to follow your health care provider's instructions. Prenatal care is all the medical care you receive before the baby's birth. This care will help prevent, find, and treat any problems during the pregnancy and childbirth. BODY CHANGES Your body goes through many changes during pregnancy. The changes vary from woman to woman.   You may gain or lose a couple of pounds at first.  You may feel sick to your stomach (nauseous) and throw up (vomit). If the vomiting is uncontrollable, call your health care provider.  You may tire easily.  You may develop headaches that can be relieved by medicines approved by your health care provider.  You may urinate more often. Painful urination may mean you have a bladder infection.  You may develop heartburn as a result of your pregnancy.  You may develop constipation because certain hormones are causing the muscles that  push waste through your intestines to slow down.  You may develop hemorrhoids or swollen, bulging veins (varicose veins).  Your breasts may begin to grow larger and become tender. Your nipples may stick out more, and the tissue that surrounds them (areola) may become darker.  Your gums may bleed and may be sensitive to brushing and flossing.  Dark spots or blotches (chloasma, mask of pregnancy) may develop on your face. This will likely fade after the baby is born.  Your menstrual periods will stop.  You may have a loss of appetite.  You may develop cravings for certain kinds of food.  You may have changes in your emotions from day to day, such as being excited to be pregnant or being concerned that something may go wrong with the pregnancy and baby.  You may have more vivid and strange dreams.  You may have changes in your hair. These can include thickening of your hair, rapid growth, and changes in texture. Some women also have hair loss during or after pregnancy, or hair that feels dry or thin. Your hair will most likely return to normal after your baby is born. WHAT TO EXPECT AT YOUR PRENATAL VISITS During a routine prenatal visit:  You will be weighed to make sure you and the baby are growing normally.  Your blood pressure will be taken.  Your abdomen will be measured to track your baby's growth.  The fetal heartbeat will be listened  to starting around week 10 or 12 of your pregnancy.  Test results from any previous visits will be discussed. Your health care provider may ask you:  How you are feeling.  If you are feeling the baby move.  If you have had any abnormal symptoms, such as leaking fluid, bleeding, severe headaches, or abdominal cramping.  If you are using any tobacco products, including cigarettes, chewing tobacco, and electronic cigarettes.  If you have any questions. Other tests that may be performed during your first trimester include:  Blood tests to  find your blood type and to check for the presence of any previous infections. They will also be used to check for low iron levels (anemia) and Rh antibodies. Later in the pregnancy, blood tests for diabetes will be done along with other tests if problems develop.  Urine tests to check for infections, diabetes, or protein in the urine.  An ultrasound to confirm the proper growth and development of the baby.  An amniocentesis to check for possible genetic problems.  Fetal screens for spina bifida and Down syndrome.  You may need other tests to make sure you and the baby are doing well.  HIV (human immunodeficiency virus) testing. Routine prenatal testing includes screening for HIV, unless you choose not to have this test. HOME CARE INSTRUCTIONS  Medicines  Follow your health care provider's instructions regarding medicine use. Specific medicines may be either safe or unsafe to take during pregnancy.  Take your prenatal vitamins as directed.  If you develop constipation, try taking a stool softener if your health care provider approves. Diet  Eat regular, well-balanced meals. Choose a variety of foods, such as meat or vegetable-based protein, fish, milk and low-fat dairy products, vegetables, fruits, and whole grain breads and cereals. Your health care provider will help you determine the amount of weight gain that is right for you.  Avoid raw meat and uncooked cheese. These carry germs that can cause birth defects in the baby.  Eating four or five small meals rather than three large meals a day may help relieve nausea and vomiting. If you start to feel nauseous, eating a few soda crackers can be helpful. Drinking liquids between meals instead of during meals also seems to help nausea and vomiting.  If you develop constipation, eat more high-fiber foods, such as fresh vegetables or fruit and whole grains. Drink enough fluids to keep your urine clear or pale yellow. Activity and  Exercise  Exercise only as directed by your health care provider. Exercising will help you:  Control your weight.  Stay in shape.  Be prepared for labor and delivery.  Experiencing pain or cramping in the lower abdomen or low back is a good sign that you should stop exercising. Check with your health care provider before continuing normal exercises.  Try to avoid standing for long periods of time. Move your legs often if you must stand in one place for a long time.  Avoid heavy lifting.  Wear low-heeled shoes, and practice good posture.  You may continue to have sex unless your health care provider directs you otherwise. Relief of Pain or Discomfort  Wear a good support bra for breast tenderness.   Take warm sitz baths to soothe any pain or discomfort caused by hemorrhoids. Use hemorrhoid cream if your health care provider approves.   Rest with your legs elevated if you have leg cramps or low back pain.  If you develop varicose veins in your legs, wear support hose.  Elevate your feet for 15 minutes, 3-4 times a day. Limit salt in your diet. Prenatal Care  Schedule your prenatal visits by the twelfth week of pregnancy. They are usually scheduled monthly at first, then more often in the last 2 months before delivery.  Write down your questions. Take them to your prenatal visits.  Keep all your prenatal visits as directed by your health care provider. Safety  Wear your seat belt at all times when driving.  Make a list of emergency phone numbers, including numbers for family, friends, the hospital, and police and fire departments. General Tips  Ask your health care provider for a referral to a local prenatal education class. Begin classes no later than at the beginning of month 6 of your pregnancy.  Ask for help if you have counseling or nutritional needs during pregnancy. Your health care provider can offer advice or refer you to specialists for help with various  needs.  Do not use hot tubs, steam rooms, or saunas.  Do not douche or use tampons or scented sanitary pads.  Do not cross your legs for long periods of time.  Avoid cat litter boxes and soil used by cats. These carry germs that can cause birth defects in the baby and possibly loss of the fetus by miscarriage or stillbirth.  Avoid all smoking, herbs, alcohol, and medicines not prescribed by your health care provider. Chemicals in these affect the formation and growth of the baby.  Do not use any tobacco products, including cigarettes, chewing tobacco, and electronic cigarettes. If you need help quitting, ask your health care provider. You may receive counseling support and other resources to help you quit.  Schedule a dentist appointment. At home, brush your teeth with a soft toothbrush and be gentle when you floss. SEEK MEDICAL CARE IF:   You have dizziness.  You have mild pelvic cramps, pelvic pressure, or nagging pain in the abdominal area.  You have persistent nausea, vomiting, or diarrhea.  You have a bad smelling vaginal discharge.  You have pain with urination.  You notice increased swelling in your face, hands, legs, or ankles. SEEK IMMEDIATE MEDICAL CARE IF:   You have a fever.  You are leaking fluid from your vagina.  You have spotting or bleeding from your vagina.  You have severe abdominal cramping or pain.  You have rapid weight gain or loss.  You vomit blood or material that looks like coffee grounds.  You are exposed to [place] measles and have never had them.  You are exposed to fifth disease or chickenpox.  You develop a severe headache.  You have shortness of breath.  You have any kind of trauma, such as from a fall or a car accident.   This information is not intended to replace advice given to you by your health care provider. Make sure you discuss any questions you have with your health care provider.   Document Released: 03/05/2001 Document  Revised: 04/01/2014 Document Reviewed: 01/19/2013 Elsevier Interactive Patient Education [employer]

## 2015-04-17 NOTE — ED Notes (Signed)
States last Tuesday noted a "knot" beside left eye. Since then redness around left orbit and a small black necrotic area outside of eye.

## 2015-06-05 DIAGNOSIS — O09893 Supervision of other high risk pregnancies, third trimester: Secondary | ICD-10-CM | POA: Insufficient documentation

## 2015-06-05 LAB — OB RESULTS CONSOLE HIV ANTIBODY (ROUTINE TESTING): HIV: NONREACTIVE

## 2015-07-04 DIAGNOSIS — O99332 Smoking (tobacco) complicating pregnancy, second trimester: Secondary | ICD-10-CM | POA: Insufficient documentation

## 2015-07-24 DIAGNOSIS — E876 Hypokalemia: Secondary | ICD-10-CM

## 2015-07-24 HISTORY — DX: Hypokalemia: E87.6

## 2015-07-31 DIAGNOSIS — Z8669 Personal history of other diseases of the nervous system and sense organs: Secondary | ICD-10-CM | POA: Insufficient documentation

## 2015-08-03 LAB — OB RESULTS CONSOLE RUBELLA ANTIBODY, IGM: RUBELLA: IMMUNE

## 2015-08-03 LAB — OB RESULTS CONSOLE RPR: RPR: NONREACTIVE

## 2015-08-03 LAB — OB RESULTS CONSOLE HEPATITIS B SURFACE ANTIGEN: Hepatitis B Surface Ag: NEGATIVE

## 2015-08-17 ENCOUNTER — Inpatient Hospital Stay
Admission: EM | Admit: 2015-08-17 | Discharge: 2015-08-18 | Disposition: A | Discharge status: Home / Self Care | Payer: Medicaid Other | Attending: Obstetrics and Gynecology | Admitting: Obstetrics and Gynecology

## 2015-08-17 DIAGNOSIS — O99342 Other mental disorders complicating pregnancy, second trimester: Secondary | ICD-10-CM | POA: Diagnosis not present

## 2015-08-17 DIAGNOSIS — Z3A20 20 weeks gestation of pregnancy: Secondary | ICD-10-CM | POA: Insufficient documentation

## 2015-08-17 DIAGNOSIS — F419 Anxiety disorder, unspecified: Secondary | ICD-10-CM | POA: Insufficient documentation

## 2015-08-17 DIAGNOSIS — R109 Unspecified abdominal pain: Secondary | ICD-10-CM | POA: Diagnosis present

## 2015-08-17 DIAGNOSIS — R197 Diarrhea, unspecified: Secondary | ICD-10-CM | POA: Diagnosis not present

## 2015-08-17 DIAGNOSIS — R05 Cough: Secondary | ICD-10-CM | POA: Diagnosis not present

## 2015-08-17 DIAGNOSIS — J069 Acute upper respiratory infection, unspecified: Secondary | ICD-10-CM | POA: Diagnosis not present

## 2015-08-17 DIAGNOSIS — D72829 Elevated white blood cell count, unspecified: Secondary | ICD-10-CM | POA: Diagnosis not present

## 2015-08-17 DIAGNOSIS — O99332 Smoking (tobacco) complicating pregnancy, second trimester: Secondary | ICD-10-CM | POA: Diagnosis not present

## 2015-08-17 DIAGNOSIS — F909 Attention-deficit hyperactivity disorder, unspecified type: Secondary | ICD-10-CM | POA: Insufficient documentation

## 2015-08-17 DIAGNOSIS — E876 Hypokalemia: Secondary | ICD-10-CM | POA: Diagnosis not present

## 2015-08-17 DIAGNOSIS — O26892 Other specified pregnancy related conditions, second trimester: Secondary | ICD-10-CM | POA: Diagnosis not present

## 2015-08-17 HISTORY — DX: Other specified health status: Z78.9

## 2015-08-17 LAB — URINALYSIS COMPLETE WITH MICROSCOPIC (ARMC ONLY)
Bilirubin Urine: NEGATIVE
GLUCOSE, UA: NEGATIVE mg/dL
Hgb urine dipstick: NEGATIVE
Leukocytes, UA: NEGATIVE
NITRITE: NEGATIVE
PROTEIN: NEGATIVE mg/dL
SPECIFIC GRAVITY, URINE: 1.02 (ref 1.005–1.030)
pH: 5 (ref 5.0–8.0)

## 2015-08-17 LAB — CBC WITH DIFFERENTIAL/PLATELET
Basophils Absolute: 0 10*3/uL (ref 0–0.1)
Basophils Relative: 0 %
EOS ABS: 0.3 10*3/uL (ref 0–0.7)
HCT: 34.5 % — ABNORMAL LOW (ref 35.0–47.0)
HEMOGLOBIN: 11.9 g/dL — AB (ref 12.0–16.0)
LYMPHS ABS: 1.4 10*3/uL (ref 1.0–3.6)
Lymphocytes Relative: 9 %
MCH: 31.2 pg (ref 26.0–34.0)
MCHC: 34.4 g/dL (ref 32.0–36.0)
MCV: 90.5 fL (ref 80.0–100.0)
Monocytes Absolute: 0.8 10*3/uL (ref 0.2–0.9)
Monocytes Relative: 5 %
Neutro Abs: 13.4 10*3/uL — ABNORMAL HIGH (ref 1.4–6.5)
PLATELETS: 167 10*3/uL (ref 150–440)
RBC: 3.81 MIL/uL (ref 3.80–5.20)
RDW: 13.9 % (ref 11.5–14.5)
WBC: 15.9 10*3/uL — AB (ref 3.6–11.0)

## 2015-08-17 LAB — COMPREHENSIVE METABOLIC PANEL
ALBUMIN: 3.4 g/dL — AB (ref 3.5–5.0)
ALK PHOS: 61 U/L (ref 38–126)
ALT: 15 U/L (ref 14–54)
AST: 20 U/L (ref 15–41)
Anion gap: 5 (ref 5–15)
CALCIUM: 9.3 mg/dL (ref 8.9–10.3)
CHLORIDE: 106 mmol/L (ref 101–111)
CO2: 26 mmol/L (ref 22–32)
CREATININE: 0.38 mg/dL — AB (ref 0.44–1.00)
GFR calc Af Amer: >60 mL/min (ref >60)
GFR calc non Af Amer: >60 mL/min (ref >60)
GLUCOSE: 85 mg/dL (ref 65–99)
Potassium: 3.1 mmol/L — ABNORMAL LOW (ref 3.5–5.1)
SODIUM: 137 mmol/L (ref 135–145)
Total Bilirubin: 0.4 mg/dL (ref 0.3–1.2)
Total Protein: 6.3 g/dL — ABNORMAL LOW (ref 6.5–8.1)

## 2015-08-17 MED ORDER — LOPERAMIDE HCL 2 MG PO CAPS
2.0000 mg | ORAL_CAPSULE | Freq: Once | ORAL | Status: AC
  Administered 2015-08-17: 2 mg via ORAL
  Filled 2015-08-17: qty 1

## 2015-08-17 MED ORDER — LACTATED RINGERS IV SOLN
INTRAVENOUS | Status: DC
  Administered 2015-08-17: 23:00:00 via INTRAVENOUS

## 2015-08-17 MED ORDER — LACTATED RINGERS IV BOLUS (SEPSIS)
500.0000 mL | Freq: Once | INTRAVENOUS | Status: AC
  Administered 2015-08-17: 500 mL via INTRAVENOUS

## 2015-08-17 MED ORDER — ACETAMINOPHEN 325 MG PO TABS
650.0000 mg | ORAL_TABLET | ORAL | Status: DC | PRN
  Administered 2015-08-17: 650 mg via ORAL
  Filled 2015-08-17: qty 2

## 2015-08-17 NOTE — OB Triage Note (Signed)
Pt arrived to OBS rm 3 with c/o Abdominal pain, diarrhea (past few days), headache, dizzy, nausea, starting last night lasting until now per patient report. Pt placed on monitor and oriented to room.

## 2015-08-17 NOTE — Discharge Summary (Addendum)
Patient's last menstrual period was 03/19/2015. Estimated Date of Delivery: 12/24/15. This is a planned pregnancy. PNC at [facility] significant for anxiety, depression, ADHD presents this pm for "aching all over, abd cramping, diarrhea" at 20 weeks of pregnancy.   ALLERGIES:  Allergies as of 06/05/2015 Loraine Leriche as Reviewed 06/05/2015  Allergen Reaction Noted  . Sulfa (sulfonamide antibiotics) Other (See Comments) 04/19/2013   MEDS:  Current Outpatient Prescriptions on File Prior to Visit  Medication Sig Dispense Refill  . prenatal vit no.124-iron-FA 27 mg iron- 800 mcg Tab Take by mouth.  . doxylamine succinate (UNISOM, DOXYLAMINE,) 25 mg tablet Take 25 mg by mouth nightly as needed for Sleep. Reported on 06/05/2015  . etonorgestrel (NEXPLANON) 68 mg implant Inject 1 each into the skin once. Reported on 06/05/2015  . ginger, Zingiber officinalis, (GINGER EXTRACT) 250 mg Cap Take by mouth every 6 (six) hours. Reported on 06/05/2015  . nicotine (NICODERM CQ) 21 mg/24 hr patch Place 1 patch onto the skin daily. Patient will start 21 mg /24 x 14 days then 14 mg/24 x 14 days the 7 mg/24 x 14 days (Patient not taking: Reported on 05/04/2015 ) 14 patch 0  . pyridoxine, vitamin B6, (VITAMIN B-6) 25 MG tablet Take 25 mg by mouth every 8 (eight) hours. Reported on 06/05/2015  . sertraline (ZOLOFT) 50 MG tablet Begin with 1/2 pill nightly for one week, then one po nightly (Patient not taking: Reported on 05/04/2015 ) 30 tablet 11   No current facility-administered medications on file prior to visit.   Past Medical History:  Past Medical History  Diagnosis Date  . Anxiety, unspecified  . Depression, unspecified  . History of ADHD   Past Surgical History:  Past Surgical History  Procedure Laterality Date  . Laparoscopy diagnostic  . Pelvic laparoscopy   Family History:  Family History  Problem Relation Age of Onset  . Depression Mother  . Depression Father  . Depression Sister  . Anxiety  disorder Sister  . Bipolar disorder Sister  . Alcohol abuse Paternal Grandmother  . Bipolar disorder Paternal Grandmother  . Depression Paternal Grandmother  . Hyperlipidemia Paternal Grandmother   Social History: reports that she has been smoking Cigarettes. She has a 7.00 pack-year smoking history. She does not have any smokeless tobacco history on file. She reports that she does not drink alcohol or use illicit drugs. OB/GYN History:  OB History  Gravida Para Term Preterm AB SAB TAB Ectopic Multiple Living  # Outcome Date GA Lbr Len/2nd Weight Sex Delivery Anes PTL Lv  6 Current  5 SAB  4 SAB  3 TAB  2 Term  1 Term    ROS: General: No weight loss or weight gain, fatigue or fevers. HEENT: No change in vision, double vision or loss of hearing. No nosebleeds, difficulty swallowing, sore throat or any other complaints.  HEART: No CP,palpatations, swelling in the feet or legs or difficulty breathing while lying flat. LUNGS: No SOB, + Cough, Congestion or wheezing. GASTROINTESTINAL: Nausea, vomiting, + diarrhea, constipation, heartburn or stomach pain. +abd cramps.  GENITOURINARY: No urgency, frequency or dysuria. MUSCULOSKELETAL: + joint pain, no weakness, cramping or back pain. NEUROLOGIC: No frequent HA's, numbness, blackouts or dizziness. PSYCHIATRIC: + anxiety, panic, +depression, no increased stress or suicidal ideation. ENDOCRINE: No heat or cold intolerance, excessive thirst or hunger. HEMATOLOGIC/LYMPH: No easy bruising or swollen lymph nodes. GYN: No vaginal bleeding, + cramping, no vaginal odor.  EXAM: VITALS:  Vitals: 115/67 O: 25 yo white female in NAD. States she is achy all over. HEENT: Normocephalic. Eyes non-icteric. Heart: S1S2, RRR, no M/R/G. Lungs: CTA bilat, no  W/R/R. Cough prod brownish.  Abd: Gravid,  FHT: 140 Doppler IN:OMVE on monitor, cx not checked.  Labs: Urine is trace for ketone, neg blood, neg protein, Neg Bilirubin, Neg  glucose, Neg leuks, ph 5.0. Spec Gravity: 1.020, Sq epithel: 6-30., WBC 0-5 Blood: Influenza A & B neg Elevated WBC: 15.9, 11.9/34.5, Potassium 3.1  A: IUP at 20 weeks with leukocytosis. P: Continue IV hydration. 2.Cough: URI 3. Diarrhea multiple episodes 4. Hypokalemia 5. Stool for rotavirus, C Difficile, Norovirus 6. Potassium replacement IV 7. Will tx with Ancef 1 gm IV and place on Keflex prior to dc.

## 2015-08-18 DIAGNOSIS — O26892 Other specified pregnancy related conditions, second trimester: Secondary | ICD-10-CM | POA: Diagnosis not present

## 2015-08-18 LAB — COMPREHENSIVE METABOLIC PANEL
ALT: 14 U/L (ref 14–54)
ANION GAP: 5 (ref 5–15)
AST: 15 U/L (ref 15–41)
Albumin: 2.7 g/dL — ABNORMAL LOW (ref 3.5–5.0)
Alkaline Phosphatase: 51 U/L (ref 38–126)
BILIRUBIN TOTAL: 0.4 mg/dL (ref 0.3–1.2)
BUN: <5 mg/dL — ABNORMAL LOW (ref 6–20)
CHLORIDE: 107 mmol/L (ref 101–111)
CO2: 26 mmol/L (ref 22–32)
Calcium: 8.6 mg/dL — ABNORMAL LOW (ref 8.9–10.3)
Creatinine, Ser: 0.39 mg/dL — ABNORMAL LOW (ref 0.44–1.00)
Glucose, Bld: 101 mg/dL — ABNORMAL HIGH (ref 65–99)
POTASSIUM: 3.6 mmol/L (ref 3.5–5.1)
Sodium: 138 mmol/L (ref 135–145)
TOTAL PROTEIN: 5.3 g/dL — AB (ref 6.5–8.1)

## 2015-08-18 LAB — INFLUENZA PANEL BY PCR (TYPE A & B)
H1N1 flu by pcr: NOT DETECTED
Influenza A By PCR: NEGATIVE
Influenza B By PCR: NEGATIVE

## 2015-08-18 MED ORDER — CEPHALEXIN 250 MG PO CAPS: 250.0000 mg | ORAL_CAPSULE | Freq: Four times a day (QID) | ORAL | Status: AC

## 2015-08-18 MED ORDER — SODIUM CHLORIDE FLUSH 0.9 % IV SOLN
INTRAVENOUS | Status: AC
  Administered 2015-08-18: 10 mL
  Filled 2015-08-18: qty 20

## 2015-08-18 MED ORDER — CEFAZOLIN SODIUM 1-5 GM-% IV SOLN
1.0000 g | Freq: Once | INTRAVENOUS | Status: AC
  Administered 2015-08-18: 1 g via INTRAVENOUS
  Filled 2015-08-18: qty 50

## 2015-08-18 MED ORDER — KCL-LACTATED RINGERS-D5W 20 MEQ/L IV SOLN
INTRAVENOUS | Status: DC
  Administered 2015-08-18: 125 mL/h via INTRAVENOUS
  Administered 2015-08-18: 02:00:00 via INTRAVENOUS
  Filled 2015-08-18 (×4): qty 1000

## 2015-08-18 NOTE — Plan of Care (Signed)
Report to MD/ Discharge orders received.  Ellison Carwin RNC

## 2015-08-18 NOTE — Progress Notes (Signed)
S: Feeling some better, no further diarrhea, still aching all over. O: Gen: 25 yo white female in NAD.  Cough prod brownish, no wheezes. GI: No further diarrhea, vomiting, no abd pain while pt on clear liqs. A: 1. IUP at 20 weeks 2. Diarrhea, abd cramping, nausea 3. Acute Bronchitis (tobacco abuse) P: Regular diet to see if pt tolerates 2 CMP to check potassium 3. Continue IV hydration with K replacement 4. Stool for C diff, viral, etc 5. Keflex 250 mg po QID x 10 days due to brown prod sputum and tobacco abuse.

## 2015-08-18 NOTE — Plan of Care (Signed)
Pt assessment. She states that she has kept her breakfast down without any trouble.  She states she has not had any BM this am.  New liter of IV fluid added at this time. Lab here for repeat K draw.  Will inform MD of results.  Ellison Carwin RNC

## 2015-08-18 NOTE — Discharge Instructions (Signed)
Keep next scheduled appt with your Doctor.  Call if any questions or concerns. [phone] C Mayrani Khamis RNC

## 2015-08-18 NOTE — Plan of Care (Signed)
Discharge instructions, both oral and written, given to pt. She is leaving department in stable condition ambulatory.  Pt given work note per MD instructions to remain out of work through tomorrow 08/19/15. Pt agrees with plan of care. Ellison Carwin RNC

## 2015-09-01 DIAGNOSIS — F329 Major depressive disorder, single episode, unspecified: Secondary | ICD-10-CM | POA: Insufficient documentation

## 2015-09-01 DIAGNOSIS — O99343 Other mental disorders complicating pregnancy, third trimester: Secondary | ICD-10-CM | POA: Insufficient documentation

## 2015-09-01 DIAGNOSIS — F32A Depression, unspecified: Secondary | ICD-10-CM | POA: Insufficient documentation

## 2015-09-08 ENCOUNTER — Encounter: Payer: Self-pay | Admitting: *Deleted

## 2015-09-08 ENCOUNTER — Observation Stay
Admission: EM | Admit: 2015-09-08 | Discharge: 2015-09-08 | Disposition: A | Discharge status: Home / Self Care | Payer: Medicaid Other | Attending: Obstetrics and Gynecology | Admitting: Obstetrics and Gynecology

## 2015-09-08 DIAGNOSIS — O26899 Other specified pregnancy related conditions, unspecified trimester: Secondary | ICD-10-CM

## 2015-09-08 DIAGNOSIS — F329 Major depressive disorder, single episode, unspecified: Secondary | ICD-10-CM | POA: Insufficient documentation

## 2015-09-08 DIAGNOSIS — R109 Unspecified abdominal pain: Secondary | ICD-10-CM

## 2015-09-08 DIAGNOSIS — O99332 Smoking (tobacco) complicating pregnancy, second trimester: Secondary | ICD-10-CM | POA: Insufficient documentation

## 2015-09-08 DIAGNOSIS — O99342 Other mental disorders complicating pregnancy, second trimester: Secondary | ICD-10-CM | POA: Diagnosis not present

## 2015-09-08 DIAGNOSIS — O9989 Other specified diseases and conditions complicating pregnancy, childbirth and the puerperium: Secondary | ICD-10-CM | POA: Diagnosis not present

## 2015-09-08 DIAGNOSIS — Z882 Allergy status to sulfonamides status: Secondary | ICD-10-CM | POA: Diagnosis not present

## 2015-09-08 DIAGNOSIS — R1013 Epigastric pain: Secondary | ICD-10-CM | POA: Diagnosis not present

## 2015-09-08 DIAGNOSIS — Z3A24 24 weeks gestation of pregnancy: Secondary | ICD-10-CM | POA: Diagnosis not present

## 2015-09-08 DIAGNOSIS — O09292 Supervision of pregnancy with other poor reproductive or obstetric history, second trimester: Secondary | ICD-10-CM | POA: Diagnosis not present

## 2015-09-08 DIAGNOSIS — F17210 Nicotine dependence, cigarettes, uncomplicated: Secondary | ICD-10-CM | POA: Insufficient documentation

## 2015-09-08 LAB — CBC WITH DIFFERENTIAL/PLATELET
Basophils Absolute: 0.1 10*3/uL (ref 0–0.1)
Basophils Relative: 1 %
Eosinophils Absolute: 0.2 10*3/uL (ref 0–0.7)
HCT: 33.3 % — ABNORMAL LOW (ref 35.0–47.0)
Hemoglobin: 11.3 g/dL — ABNORMAL LOW (ref 12.0–16.0)
LYMPHS ABS: 1.5 10*3/uL (ref 1.0–3.6)
MCH: 31.5 pg (ref 26.0–34.0)
MCHC: 33.9 g/dL (ref 32.0–36.0)
MCV: 93 fL (ref 80.0–100.0)
MONO ABS: 0.6 10*3/uL (ref 0.2–0.9)
Neutro Abs: 10.5 10*3/uL — ABNORMAL HIGH (ref 1.4–6.5)
Neutrophils Relative %: 80 %
Platelets: 184 10*3/uL (ref 150–440)
RBC: 3.58 MIL/uL — ABNORMAL LOW (ref 3.80–5.20)
RDW: 14 % (ref 11.5–14.5)
WBC: 12.9 10*3/uL — ABNORMAL HIGH (ref 3.6–11.0)

## 2015-09-08 LAB — WET PREP, GENITAL
Clue Cells Wet Prep HPF POC: NONE SEEN
SPERM: NONE SEEN
Trich, Wet Prep: NONE SEEN
Yeast Wet Prep HPF POC: NONE SEEN

## 2015-09-08 LAB — URINALYSIS COMPLETE WITH MICROSCOPIC (ARMC ONLY)
Bilirubin Urine: NEGATIVE
GLUCOSE, UA: NEGATIVE mg/dL
Hgb urine dipstick: NEGATIVE
KETONES UR: NEGATIVE mg/dL
Leukocytes, UA: NEGATIVE
NITRITE: NEGATIVE
PROTEIN: NEGATIVE mg/dL
RBC / HPF: NONE SEEN RBC/hpf (ref 0–5)
SPECIFIC GRAVITY, URINE: 1.015 (ref 1.005–1.030)
pH: 6 (ref 5.0–8.0)

## 2015-09-08 LAB — COMPREHENSIVE METABOLIC PANEL
ALBUMIN: 2.9 g/dL — AB (ref 3.5–5.0)
ALK PHOS: 61 U/L (ref 38–126)
ALT: 13 U/L — AB (ref 14–54)
AST: 15 U/L (ref 15–41)
Anion gap: 7 (ref 5–15)
BUN: 6 mg/dL (ref 6–20)
CALCIUM: 8.8 mg/dL — AB (ref 8.9–10.3)
CO2: 24 mmol/L (ref 22–32)
CREATININE: 0.33 mg/dL — AB (ref 0.44–1.00)
Chloride: 106 mmol/L (ref 101–111)
GFR calc non Af Amer: >60 mL/min (ref >60)
GLUCOSE: 99 mg/dL (ref 65–99)
Potassium: 3.3 mmol/L — ABNORMAL LOW (ref 3.5–5.1)
SODIUM: 137 mmol/L (ref 135–145)
Total Bilirubin: 0.7 mg/dL (ref 0.3–1.2)
Total Protein: 5.7 g/dL — ABNORMAL LOW (ref 6.5–8.1)

## 2015-09-08 LAB — AMYLASE: Amylase: 61 U/L (ref 28–100)

## 2015-09-08 LAB — LIPASE, BLOOD: Lipase: 19 U/L (ref 11–51)

## 2015-09-08 MED ORDER — FAMOTIDINE 20 MG PO TABS
20.0000 mg | ORAL_TABLET | Freq: Once | ORAL | Status: AC
  Administered 2015-09-08: 20 mg via ORAL
  Filled 2015-09-08: qty 1

## 2015-09-08 MED ORDER — RANITIDINE HCL 150 MG PO TABS: 150.0000 mg | ORAL_TABLET | Freq: Two times a day (BID) | ORAL | Status: DC

## 2015-09-08 MED ORDER — POTASSIUM CHLORIDE CRYS ER 20 MEQ PO TBCR
10.0000 meq | EXTENDED_RELEASE_TABLET | Freq: Once | ORAL | Status: AC
  Administered 2015-09-08: 10 meq via ORAL
  Filled 2015-09-08: qty 1

## 2015-09-08 NOTE — Plan of Care (Signed)
Up to dress for discharge. Discharge instructions given and explained.  Verbalized understanding.  Sign copy on chart and one in hand.

## 2015-09-08 NOTE — OB Triage Note (Signed)
Rcvd from ED per wheelchair to OBS 4 with c/o upper abdominal pain since 0930 this AM.  Changed to gown and to bed.  EFM applied.  Plan of care discussed.  Verbalized understanding, agrees with plan of care.

## 2015-09-08 NOTE — MAU Provider Note (Signed)
History     CSN: [id number]  Arrival date and time: 09/08/15 1300   None     Chief Complaint  Patient presents with  . Abdominal Pain   HPI Erin Ellis is a G8P2 at 24+5 weeks by LMP 03/19/15, with an EDD of 12/24/15 presenting today with complaints of upper abdominal pain that started around 9:30am.  She states it felt like a "constant contraction like I was in labor."  She reports eating a frozen pizza for breakfast about 1030-11, and laid down after, but still felt the pain.  She states the pain was so severe, "it took my breath away and I could not find a comfortable position."  She reports the pain is worse when the baby is moving.  She denies nausea/vomiting/constipation/diarrhea/ hematochezia/hematemesis.  She states she was seen at the end of May for diarrhea and abdominal pain, but now it has resolved.  Her LBM was yesterday.  She also reports copious discharge with occasional foul odor.  She denies LOF and vaginal bleeding.  She is currently smoking 0.25 PPD.  She is also on Wellbutrin for depression and reports feeling good today and denies SI, HI.    Past Medical History  Diagnosis Date  . Miscarriage   . Abortion     In 2015 (8 weeks, 4 days pregnant)  . Medical history non-contributory     Past Surgical History  Procedure Laterality Date  . Abdominal exploration surgery      Family History  Problem Relation Age of Onset  . Hypertension Mother     Social History  Substance Use Topics  . Smoking status: Current Every Day Smoker -- 0.25 packs/day    Types: Cigarettes  . Smokeless tobacco: None  . Alcohol Use: No    Allergies:  Allergies  Allergen Reactions  . Sulfa Antibiotics Hives, Swelling and Rash    Unknown reaction    Prescriptions prior to admission  Medication Sig Dispense Refill Last Dose  . Prenatal Vit-Fe Fumarate-FA (PRENATAL MULTIVITAMIN) TABS tablet Take 1 tablet by mouth daily at 12 noon.   08/17/2015 at Unknown time    Review of  Systems  Constitutional: Negative for fever and chills.  HENT: Negative.   Respiratory: Negative.   Cardiovascular: Negative.   Gastrointestinal: Positive for heartburn and abdominal pain. Negative for nausea, vomiting, diarrhea, constipation and blood in stool.       Mid-epigastric abdominal pain  Genitourinary: Negative for dysuria, urgency and frequency.       Abnormal vaginal discharge with odor  Musculoskeletal: Negative.   Skin: Negative.   Neurological: Negative.    Physical Exam   Blood pressure 117/69, pulse 98, temperature 98.3 F (36.8 C), temperature source Oral, resp. rate 16, height  (1.6 m), weight 57.607 kg (127 lb), last menstrual period 03/19/2015.  Physical Exam  Constitutional: She is oriented to person, place, and time. She appears well-developed and well-nourished.  GI: Bowel sounds are normal. She exhibits no distension. There is tenderness. There is no rebound and no guarding.  Epigastric tenderness on palpation  No RUQ, RLQ, or LLQ tenderness  Genitourinary:  Uterus: gravid, non-tender Vagina: thin, white discharge noted - wet prep performed   Musculoskeletal: Normal range of motion.  Neurological: She is alert and oriented to person, place, and time.  Skin: Skin is warm and dry.  Psychiatric: She has a normal mood and affect. Her behavior is normal. Thought content normal.   Fetal monitoring: Baseline: 150 bpm /Moderate variability/+accels/ no decels  Toco: quiet   Results for orders placed or performed during the hospital encounter of 09/08/15 (from the past 24 hour(s))  Urinalysis complete, with microscopic ([facility] only)     Status: Abnormal   Collection Time: 09/08/15  1:14 PM  Result Value Ref Range   Color, Urine YELLOW (A) YELLOW   APPearance HAZY (A) CLEAR   Glucose, UA NEGATIVE NEGATIVE mg/dL   Bilirubin Urine NEGATIVE NEGATIVE   Ketones, ur NEGATIVE NEGATIVE mg/dL   Specific Gravity, Urine 1.015 1.005 - 1.030   Hgb urine dipstick  NEGATIVE NEGATIVE   pH 6.0 5.0 - 8.0   Protein, ur NEGATIVE NEGATIVE mg/dL   Nitrite NEGATIVE NEGATIVE   Leukocytes, UA NEGATIVE NEGATIVE   RBC / HPF NONE SEEN 0 - 5 RBC/hpf   WBC, UA 0-5 0 - 5 WBC/hpf   Bacteria, UA MANY (A) NONE SEEN   Squamous Epithelial / LPF 0-5 (A) NONE SEEN   Mucous PRESENT   Wet prep, genital     Status: Abnormal   Collection Time: 09/08/15  1:14 PM  Result Value Ref Range   Yeast Wet Prep HPF POC NONE SEEN NONE SEEN   Trich, Wet Prep NONE SEEN NONE SEEN   Clue Cells Wet Prep HPF POC NONE SEEN NONE SEEN   WBC, Wet Prep HPF POC MODERATE (A) NONE SEEN   Sperm NONE SEEN   CBC with Differential/Platelet     Status: Abnormal   Collection Time: 09/08/15  3:00 PM  Result Value Ref Range   WBC 12.9 (H) 3.6 - 11.0 K/uL   RBC 3.58 (L) 3.80 - 5.20 MIL/uL   Hemoglobin 11.3 (L) 12.0 - 16.0 g/dL   HCT 60.4 (L) 54.0 - 98.1 %   MCV 93.0 80.0 - 100.0 fL   MCH 31.5 26.0 - 34.0 pg   MCHC 33.9 32.0 - 36.0 g/dL   RDW 19.1 47.8 - 29.5 %   Platelets 184 150 - 440 K/uL   Neutrophils Relative % 80% %   Neutro Abs 10.5 (H) 1.4 - 6.5 K/uL   Lymphocytes Relative 12% %   Lymphs Abs 1.5 1.0 - 3.6 K/uL   Monocytes Relative 5% %   Monocytes Absolute 0.6 0.2 - 0.9 K/uL   Eosinophils Relative 2% %   Eosinophils Absolute 0.2 0 - 0.7 K/uL   Basophils Relative 1% %   Basophils Absolute 0.1 0 - 0.1 K/uL  Comprehensive metabolic panel     Status: Abnormal   Collection Time: 09/08/15  3:00 PM  Result Value Ref Range   Sodium 137 135 - 145 mmol/L   Potassium 3.3 (L) 3.5 - 5.1 mmol/L   Chloride 106 101 - 111 mmol/L   CO2 24 22 - 32 mmol/L   Glucose, Bld 99 65 - 99 mg/dL   BUN 6 6 - 20 mg/dL   Creatinine, Ser 6.21 (L) 0.44 - 1.00 mg/dL   Calcium 8.8 (L) 8.9 - 10.3 mg/dL   Total Protein 5.7 (L) 6.5 - 8.1 g/dL   Albumin 2.9 (L) 3.5 - 5.0 g/dL   AST 15 15 - 41 U/L   ALT 13 (L) 14 - 54 U/L   Alkaline Phosphatase 61 38 - 126 U/L   Total Bilirubin 0.7 0.3 - 1.2 mg/dL   GFR calc non  Af Amer >60 >60 mL/min   GFR calc Af Amer >60 >60 mL/min   Anion gap 7 5 - 15  Amylase     Status: None   Collection Time: 09/08/15  3:00 PM  Result Value Ref Range   Amylase 61 28 - 100 U/L  Lipase, blood     Status: None   Collection Time: 09/08/15  3:00 PM  Result Value Ref Range   Lipase 19 11 - 51 U/L     Procedures    Assessment and Plan  IUP at 24+5 weeks  Epigastric pain  CBC, CMP, Amylase, Lipase, Wet prep Continuous Fetal Monitoring and Toco monitoring At 1645, pt. Reports Pain has subsided and states she has been able to rest Pepcid 20mg  x 1 dose and eat light meal - avoid fried, greasy, high fat foods Labs WNL  Low K+ - replace with oral KCL 10 mEq x1 dose  Reassuring fetal monitoring for gestational age  D/C home with precautions  Begin Zantac 150mg  BID  Advised to avoid  fried, greasy, high fat foods F/U at [facility] in 1 week  Dr. Feliberto Gottron aware and agrees with plan  Karena Addison 09/08/2015, 4:31 PM

## 2015-09-08 NOTE — Final Progress Note (Signed)
Physician Final Progress Note  Patient ID: Erin Ellis MRN: [id number] DOB/AGE: 05/23/1990 25 y.o.  Admit date: 09/08/2015 Admitting provider: Suzy Bouchard, MD Discharge date: 09/08/2015   Admission Diagnoses: Epigastric pain affecting pregnancy in 2nd trimester  Discharge Diagnoses:  Active Problems:   Abdominal pain affecting pregnancy, antepartum - resolved Depression in pregnancy Smoking cessation provided   Significant Findings/ Diagnostic Studies: see triage note  Discharge Condition: good  Disposition: 01-Home or Self Care  Diet: Regular diet  Discharge Activity: Activity as tolerated  Discharge Instructions    Discharge activity:  No Restrictions    Complete by:  As directed      Discharge diet:    Complete by:  As directed   Low fat, avoid fried greasy foods     No sexual activity restrictions    Complete by:  As directed      Notify physician for a general feeling that "something is not right"    Complete by:  As directed      Notify physician for increase or change in vaginal discharge    Complete by:  As directed      Notify physician for intestinal cramps, with or without diarrhea, sometimes described as "gas pain"    Complete by:  As directed      Notify physician for leaking of fluid    Complete by:  As directed      Notify physician for low, dull backache, unrelieved by heat or Tylenol    Complete by:  As directed      Notify physician for menstrual like cramps    Complete by:  As directed      Notify physician for pelvic pressure    Complete by:  As directed      Notify physician for uterine contractions.  These may be painless and feel like the uterus is tightening or the baby is  "balling up"    Complete by:  As directed      Notify physician for vaginal bleeding    Complete by:  As directed      PRETERM LABOR:  Includes any of the follwing symptoms that occur between 20 - [redacted] weeks gestation.  If these symptoms are not stopped,  preterm labor can result in preterm delivery, placing your baby at risk    Complete by:  As directed             Medication List    TAKE these medications        prenatal multivitamin Tabs tablet  Take 1 tablet by mouth daily at 12 noon.     ranitidine 150 MG tablet  Commonly known as:  ZANTAC  Take 1 tablet (150 mg total) by mouth 2 (two) times daily.           Follow-up Information    Follow up with Karena Addison, CNM. Schedule an appointment as soon as possible for a visit in 1 week.   Specialty:  Certified Nurse Midwife   Contact information:   [address] [place] [address] [phone]        Signed: Karena Addison 09/08/2015, 11:13 PM

## 2015-09-18 ENCOUNTER — Observation Stay
Admission: EM | Admit: 2015-09-18 | Discharge: 2015-09-19 | Disposition: A | Discharge status: Home / Self Care | Payer: Medicaid Other | Attending: Obstetrics and Gynecology | Admitting: Obstetrics and Gynecology

## 2015-09-18 DIAGNOSIS — Z882 Allergy status to sulfonamides status: Secondary | ICD-10-CM | POA: Diagnosis not present

## 2015-09-18 DIAGNOSIS — Z87891 Personal history of nicotine dependence: Secondary | ICD-10-CM | POA: Insufficient documentation

## 2015-09-18 DIAGNOSIS — O4702 False labor before 37 completed weeks of gestation, second trimester: Secondary | ICD-10-CM | POA: Diagnosis present

## 2015-09-18 DIAGNOSIS — O368120 Decreased fetal movements, second trimester, not applicable or unspecified: Secondary | ICD-10-CM | POA: Diagnosis not present

## 2015-09-18 DIAGNOSIS — O4700 False labor before 37 completed weeks of gestation, unspecified trimester: Secondary | ICD-10-CM | POA: Diagnosis present

## 2015-09-18 DIAGNOSIS — O99342 Other mental disorders complicating pregnancy, second trimester: Secondary | ICD-10-CM | POA: Diagnosis not present

## 2015-09-18 DIAGNOSIS — F419 Anxiety disorder, unspecified: Secondary | ICD-10-CM | POA: Diagnosis not present

## 2015-09-18 DIAGNOSIS — F329 Major depressive disorder, single episode, unspecified: Secondary | ICD-10-CM | POA: Diagnosis not present

## 2015-09-18 DIAGNOSIS — Z3A27 27 weeks gestation of pregnancy: Secondary | ICD-10-CM | POA: Diagnosis not present

## 2015-09-18 HISTORY — DX: Headache, unspecified: R51.9

## 2015-09-18 HISTORY — DX: Anxiety disorder, unspecified: F41.9

## 2015-09-18 HISTORY — DX: Headache: R51

## 2015-09-18 HISTORY — DX: Depression, unspecified: F32.A

## 2015-09-18 HISTORY — DX: Major depressive disorder, single episode, unspecified: F32.9

## 2015-09-18 LAB — URINALYSIS COMPLETE WITH MICROSCOPIC (ARMC ONLY)
BILIRUBIN URINE: NEGATIVE
Glucose, UA: NEGATIVE mg/dL
HGB URINE DIPSTICK: NEGATIVE
Ketones, ur: NEGATIVE mg/dL
LEUKOCYTES UA: NEGATIVE
Nitrite: NEGATIVE
PH: 7 (ref 5.0–8.0)
PROTEIN: NEGATIVE mg/dL
RBC / HPF: NONE SEEN RBC/hpf (ref 0–5)
Specific Gravity, Urine: 1.017 (ref 1.005–1.030)

## 2015-09-18 LAB — WET PREP, GENITAL
Clue Cells Wet Prep HPF POC: NONE SEEN
Sperm: NONE SEEN
TRICH WET PREP: NONE SEEN
YEAST WET PREP: NONE SEEN

## 2015-09-18 MED ORDER — ACETAMINOPHEN 325 MG PO TABS
650.0000 mg | ORAL_TABLET | Freq: Once | ORAL | Status: AC
  Administered 2015-09-18: 650 mg via ORAL
  Filled 2015-09-18: qty 2

## 2015-09-18 NOTE — OB Triage Note (Addendum)
Pt arrived to OBS triage to be evaluated for painful contractions, "its making me say this is happening all day, started getting worse than earlier" feels more than braxton hicks, "making me feel uncomfortable". Says she stopped smoking, cold [place], today makes 5 days, "Thurs was the worst day of my life" Says she is not sure if quitting smoking and going through withdrawal is triggering lower abdominal pain, couple times she felt sharp, shooting pain in vaginal area. Confirms fetal movement but not like she normally does. Noticing heavy vaginal discharge with thick mucus, denies vaginal bleeding, spotting, leaking or gush of fluid. Intercourse yesterday morning. Urinary urgency and frequency today, denies strange odor, fever or chills.

## 2015-09-19 DIAGNOSIS — O4702 False labor before 37 completed weeks of gestation, second trimester: Secondary | ICD-10-CM | POA: Diagnosis not present

## 2015-09-19 LAB — CHLAMYDIA/NGC RT PCR (ARMC ONLY)
Chlamydia Tr: NOT DETECTED
N GONORRHOEAE: NOT DETECTED

## 2015-09-19 LAB — URINE DRUG SCREEN, QUALITATIVE (ARMC ONLY)
Amphetamines, Ur Screen: NOT DETECTED
BARBITURATES, UR SCREEN: POSITIVE — AB
Benzodiazepine, Ur Scrn: NOT DETECTED
CANNABINOID 50 NG, UR ~~LOC~~: NOT DETECTED
COCAINE METABOLITE, UR ~~LOC~~: NOT DETECTED
MDMA (Ecstasy)Ur Screen: NOT DETECTED
Methadone Scn, Ur: NOT DETECTED
Opiate, Ur Screen: NOT DETECTED
PHENCYCLIDINE (PCP) UR S: NOT DETECTED
TRICYCLIC, UR SCREEN: NOT DETECTED

## 2015-09-19 NOTE — Final Progress Note (Signed)
Physician Final Progress Note  Patient ID: Erin Ellis MRN: [id number] DOB/AGE: 1990-04-09 25 y.o.  Admit date: 09/18/2015 Admitting provider: Christeen Douglas, MD Discharge date: 09/19/2015   Admission Diagnoses: Threatened PTL at 26+1 weeks   Discharge Diagnoses:  Active Problems:   Threatened preterm labor - resolved  Significant Findings/ Diagnostic Studies: Results for orders placed or performed during the hospital encounter of 09/18/15 (from the past 24 hour(s))  Urinalysis complete, with microscopic ([facility] only)     Status: Abnormal   Collection Time: 09/18/15  9:30 PM  Result Value Ref Range   Color, Urine YELLOW (A) YELLOW   APPearance CLEAR (A) CLEAR   Glucose, UA NEGATIVE NEGATIVE mg/dL   Bilirubin Urine NEGATIVE NEGATIVE   Ketones, ur NEGATIVE NEGATIVE mg/dL   Specific Gravity, Urine 1.017 1.005 - 1.030   Hgb urine dipstick NEGATIVE NEGATIVE   pH 7.0 5.0 - 8.0   Protein, ur NEGATIVE NEGATIVE mg/dL   Nitrite NEGATIVE NEGATIVE   Leukocytes, UA NEGATIVE NEGATIVE   RBC / HPF NONE SEEN 0 - 5 RBC/hpf   WBC, UA 0-5 0 - 5 WBC/hpf   Bacteria, UA RARE (A) NONE SEEN   Squamous Epithelial / LPF 0-5 (A) NONE SEEN   Mucous PRESENT   Wet prep, genital     Status: Abnormal   Collection Time: 09/18/15 11:10 PM  Result Value Ref Range   Yeast Wet Prep HPF POC NONE SEEN NONE SEEN   Trich, Wet Prep NONE SEEN NONE SEEN   Clue Cells Wet Prep HPF POC NONE SEEN NONE SEEN   WBC, Wet Prep HPF POC FEW (A) NONE SEEN   Sperm NONE SEEN   Chlamydia/NGC rt PCR ([facility] only)     Status: None   Collection Time: 09/19/15 12:30 AM  Result Value Ref Range   Specimen source GC/Chlam URINE, RANDOM    Chlamydia Tr NOT DETECTED NOT DETECTED   N gonorrhoeae NOT DETECTED NOT DETECTED  Urine Drug Screen, Qualitative ([facility] only)     Status: Abnormal   Collection Time: 09/19/15 12:30 AM  Result Value Ref Range   Tricyclic, Ur Screen NONE DETECTED NONE DETECTED   Amphetamines, Ur Screen NONE  DETECTED NONE DETECTED   MDMA (Ecstasy)Ur Screen NONE DETECTED NONE DETECTED   Cocaine Metabolite,Ur [place] NONE DETECTED NONE DETECTED   Opiate, Ur Screen NONE DETECTED NONE DETECTED   Phencyclidine (PCP) Ur S NONE DETECTED NONE DETECTED   Cannabinoid 50 Ng, Ur [place] NONE DETECTED NONE DETECTED   Barbiturates, Ur Screen POSITIVE (A) NONE DETECTED   Benzodiazepine, Ur Scrn NONE DETECTED NONE DETECTED   Methadone Scn, Ur NONE DETECTED NONE DETECTED    Discharge Condition: good  Disposition: 01-Home or Self Care  Diet: Regular diet  Discharge Activity: Activity as tolerated     Medication List    ASK your doctor about these medications        buPROPion 150 MG 12 hr tablet  Commonly known as:  WELLBUTRIN SR  Take 150 mg by mouth 2 (two) times daily.     Butalbital-APAP-Caffeine 50-325-40 MG capsule  Take 1 capsule by mouth every 4 (four) hours as needed for pain.     prenatal multivitamin Tabs tablet  Take 1 tablet by mouth daily at 12 noon.     ranitidine 150 MG tablet  Commonly known as:  ZANTAC  Take 1 tablet (150 mg total) by mouth 2 (two) times daily.        Signed: Karena Addison 09/19/2015,  6:25 AM

## 2015-09-19 NOTE — OB Triage Provider Note (Signed)
History     CSN: [id number]  Arrival date and time: 09/18/15 2038   None     Chief Complaint  Patient presents with  . Contractions    r/o PTL   HPI Erin Ellis is a G8P2 at 26+1 weeks by LMP of 03/19/15 presenting today with intermittent abdominal pain that "feels like contractions."  She also reports decreased fetal movement.  She reports since admission and resting, pain is relieved, but will feel "sharp, sore pains" with fetal movement.  She points to pubic bone and round ligament area when she has the pains.  She denies vaginal bleeding or LOF.  She states she had recent intercourse yesterday.  She report thick, white discharge.  She also reports she stopped smoking 5 days ago, and believes she is still having some withdrawal symptoms that is causing anxiety, which leads to concern about her pregnancy.    Past Medical History  Diagnosis Date  . Miscarriage   . Abortion     In 2015 (8 weeks, 4 days pregnant)  . Medical history non-contributory   . Depression   . Anxiety   . Headache     Past Surgical History  Procedure Laterality Date  . Abdominal exploration surgery      Family History  Problem Relation Age of Onset  . Hypertension Mother     Social History  Substance Use Topics  . Smoking status: Former Smoker -- 1.00 packs/day    Types: Cigarettes    Quit date: 09/11/2015  . Smokeless tobacco: None  . Alcohol Use: No    Allergies:  Allergies  Allergen Reactions  . Sulfa Antibiotics Hives, Swelling and Rash    Unknown reaction    No prescriptions prior to admission    Review of Systems  Constitutional: Negative.   HENT: Negative.   Eyes: Negative.   Respiratory: Negative.   Cardiovascular: Negative.   Gastrointestinal: Positive for abdominal pain.       Intermittent, low sharp abd pain  Genitourinary: Negative for dysuria, urgency and frequency.       +thick white discharge   Musculoskeletal: Negative.   Skin: Negative.   Neurological:  Negative.   Endo/Heme/Allergies: Negative.   Psychiatric/Behavioral: The patient is nervous/anxious.    Physical Exam   Blood pressure 100/54, pulse 75, temperature 98.2 F (36.8 C), temperature source Oral, resp. rate 16, height  (1.6 m), weight 57.607 kg (127 lb), last menstrual period 03/19/2015.  Physical Exam  Constitutional: She is oriented to person, place, and time. She appears well-developed and well-nourished.  HENT:  Head: Normocephalic.  Eyes: Pupils are equal, round, and reactive to light.  Neck: Normal range of motion.  Cardiovascular: Normal rate and regular rhythm.   Respiratory: Effort normal and breath sounds normal.  GI: Soft. Bowel sounds are normal.  Genitourinary: Uterus normal.  Gravid, non-tender, no ctxs palpated when pt. C/o of pain  Musculoskeletal: Normal range of motion.  Neurological: She is alert and oriented to person, place, and time.  Sterile Speculum Exam: normal physiologic discharge, no evidence of premature dilation of cervix  SVE: external os: FTP/ internal os: closed/ thick   Fetal monitoring: Baseline: 145 bpm/ Moderate variability/ +accels/ no decels Toco: UI     Procedures   Assessment and Plan  IUP at 26+1 weeks Lower abdominal pain and Threatened Preterm labor  - UA/UC  - Wet prep  - GC/CT  - UDS  - unable to obtain FFN d/t recent intercourse  - Oral hydration  Continuous fetal monitoring and toco Tylenol 650mg  x 1 dose   Karena Addison 09/19/2015, 1:08 AM   Reassement:  Pain has improved with Tylenol  Reassuring fetal monitoring for current gestational age  Wet prep, UA negative UDS + barbiturates- patient has been taking Fioricet PRN  GC/CT Negative Preterm labor and warning s/s reviewed - discussed possible BMZ and FFN if she has current s/s of PTL Praised for smoking cessation  [facility]'s daily F/U at regularly scheduled office visit  Dr. Dalbert Garnet aware and agrees with plan of care  Carlean Jews, CNM

## 2015-09-20 LAB — URINE CULTURE

## 2015-09-23 ENCOUNTER — Observation Stay
Admission: EM | Admit: 2015-09-23 | Discharge: 2015-09-24 | Disposition: A | Discharge status: Home / Self Care | Payer: Medicaid Other | Attending: Obstetrics and Gynecology | Admitting: Obstetrics and Gynecology

## 2015-09-23 DIAGNOSIS — O9989 Other specified diseases and conditions complicating pregnancy, childbirth and the puerperium: Secondary | ICD-10-CM | POA: Diagnosis present

## 2015-09-23 DIAGNOSIS — R1010 Upper abdominal pain, unspecified: Secondary | ICD-10-CM | POA: Diagnosis not present

## 2015-09-23 DIAGNOSIS — Z3A28 28 weeks gestation of pregnancy: Secondary | ICD-10-CM | POA: Insufficient documentation

## 2015-09-23 DIAGNOSIS — O3490 Maternal care for abnormality of pelvic organ, unspecified, unspecified trimester: Secondary | ICD-10-CM | POA: Diagnosis present

## 2015-09-23 HISTORY — DX: Adverse effect of unspecified anesthetic, initial encounter: T41.45XA

## 2015-09-23 HISTORY — DX: Other complications of anesthesia, initial encounter: T88.59XA

## 2015-09-23 LAB — CBC
HCT: 32 % — ABNORMAL LOW (ref 35.0–47.0)
HEMOGLOBIN: 11.2 g/dL — AB (ref 12.0–16.0)
MCH: 32.2 pg (ref 26.0–34.0)
MCHC: 35 g/dL (ref 32.0–36.0)
MCV: 92.1 fL (ref 80.0–100.0)
Platelets: 182 10*3/uL (ref 150–440)
RBC: 3.48 MIL/uL — ABNORMAL LOW (ref 3.80–5.20)
RDW: 13.6 % (ref 11.5–14.5)
WBC: 14.9 10*3/uL — ABNORMAL HIGH (ref 3.6–11.0)

## 2015-09-23 MED ORDER — HYDROCODONE-ACETAMINOPHEN 5-325 MG PO TABS
1.0000 | ORAL_TABLET | ORAL | Status: DC | PRN
  Administered 2015-09-23 – 2015-09-24 (×2): 1 via ORAL
  Filled 2015-09-23 (×2): qty 1

## 2015-09-23 NOTE — OB Triage Note (Signed)
Pt arrived complaining of  epigastric pain that radiates down the right and left side of the abdomen. +FM, denies contractions, bleeding or leaking fluid.Denies HA (on fioricet), denies blurred vision or increased swelling.  Pt states that she has been having pain off and on for 2 weeks and that it has been getting increasingly worse today to the point she cannot do anything to lessen the pain.

## 2015-09-24 DIAGNOSIS — O9989 Other specified diseases and conditions complicating pregnancy, childbirth and the puerperium: Secondary | ICD-10-CM | POA: Diagnosis not present

## 2015-09-24 LAB — COMPREHENSIVE METABOLIC PANEL
ALT: 15 U/L (ref 14–54)
ANION GAP: 9 (ref 5–15)
AST: 21 U/L (ref 15–41)
Albumin: 2.9 g/dL — ABNORMAL LOW (ref 3.5–5.0)
Alkaline Phosphatase: 68 U/L (ref 38–126)
BILIRUBIN TOTAL: 0.3 mg/dL (ref 0.3–1.2)
BUN: 7 mg/dL (ref 6–20)
CHLORIDE: 105 mmol/L (ref 101–111)
CO2: 23 mmol/L (ref 22–32)
Calcium: 8.7 mg/dL — ABNORMAL LOW (ref 8.9–10.3)
Creatinine, Ser: 0.49 mg/dL (ref 0.44–1.00)
Glucose, Bld: 113 mg/dL — ABNORMAL HIGH (ref 65–99)
POTASSIUM: 3.3 mmol/L — AB (ref 3.5–5.1)
Sodium: 137 mmol/L (ref 135–145)
TOTAL PROTEIN: 5.7 g/dL — AB (ref 6.5–8.1)

## 2015-09-24 LAB — URINALYSIS COMPLETE WITH MICROSCOPIC (ARMC ONLY)
Bilirubin Urine: NEGATIVE
Glucose, UA: NEGATIVE mg/dL
HGB URINE DIPSTICK: NEGATIVE
KETONES UR: NEGATIVE mg/dL
LEUKOCYTES UA: NEGATIVE
Nitrite: NEGATIVE
PH: 6 (ref 5.0–8.0)
PROTEIN: NEGATIVE mg/dL
RBC / HPF: NONE SEEN RBC/hpf (ref 0–5)
Specific Gravity, Urine: 1.018 (ref 1.005–1.030)

## 2015-09-24 NOTE — Discharge Summary (Signed)
  Pt observed for 3 hours on L+D for left abd pain and upper abd pain .Marland Kitchen norco given and pain improved . Reassuring fetal monitoring . Instructed to RTC if pain returns . D/C home

## 2015-09-24 NOTE — Discharge Instructions (Signed)
Fetal Movement Counts °Patient Name: __________________________________________________ Patient Due Date: ____________________ °Performing a fetal movement count is highly recommended in high-risk pregnancies, but it is good for every pregnant woman to do. Your health care provider may ask you to start counting fetal movements at 28 weeks of the pregnancy. Fetal movements often increase: °· After eating a full meal. °· After physical activity. °· After eating or drinking something sweet or cold. °· At rest. °Pay attention to when you feel the baby is most active. This will help you notice a pattern of your baby's sleep and wake cycles and what factors contribute to an increase in fetal movement. It is important to perform a fetal movement count at the same time each day when your baby is normally most active.  °HOW TO COUNT FETAL MOVEMENTS °1. Find a quiet and comfortable area to sit or lie down on your left side. Lying on your left side provides the best blood and oxygen circulation to your baby. °2. Write down the day and time on a sheet of paper or in a journal. °3. Start counting kicks, flutters, swishes, rolls, or jabs in a 2-hour period. You should feel at least 10 movements within 2 hours. °4. If you do not feel 10 movements in 2 hours, wait 2-3 hours and count again. Look for a change in the pattern or not enough counts in 2 hours. °SEEK MEDICAL CARE IF: °· You feel less than 10 counts in 2 hours, tried twice. °· There is no movement in over an hour. °· The pattern is changing or taking longer each day to reach 10 counts in 2 hours. °· You feel the baby is not moving as he or she usually does. °Date: ____________ Movements: ____________ Start time: ____________ Finish time: ____________  °Date: ____________ Movements: ____________ Start time: ____________ Finish time: ____________ °Date: ____________ Movements: ____________ Start time: ____________ Finish time: ____________ °Date: ____________ Movements:  ____________ Start time: ____________ Finish time: ____________ °Date: ____________ Movements: ____________ Start time: ____________ Finish time: ____________ °Date: ____________ Movements: ____________ Start time: ____________ Finish time: ____________ °Date: ____________ Movements: ____________ Start time: ____________ Finish time: ____________ °Date: ____________ Movements: ____________ Start time: ____________ Finish time: ____________  °Date: ____________ Movements: ____________ Start time: ____________ Finish time: ____________ °Date: ____________ Movements: ____________ Start time: ____________ Finish time: ____________ °Date: ____________ Movements: ____________ Start time: ____________ Finish time: ____________ °Date: ____________ Movements: ____________ Start time: ____________ Finish time: ____________ °Date: ____________ Movements: ____________ Start time: ____________ Finish time: ____________ °Date: ____________ Movements: ____________ Start time: ____________ Finish time: ____________ °Date: ____________ Movements: ____________ Start time: ____________ Finish time: ____________  °Date: ____________ Movements: ____________ Start time: ____________ Finish time: ____________ °Date: ____________ Movements: ____________ Start time: ____________ Finish time: ____________ °Date: ____________ Movements: ____________ Start time: ____________ Finish time: ____________ °Date: ____________ Movements: ____________ Start time: ____________ Finish time: ____________ °Date: ____________ Movements: ____________ Start time: ____________ Finish time: ____________ °Date: ____________ Movements: ____________ Start time: ____________ Finish time: ____________ °Date: ____________ Movements: ____________ Start time: ____________ Finish time: ____________  °Date: ____________ Movements: ____________ Start time: ____________ Finish time: ____________ °Date: ____________ Movements: ____________ Start time: ____________ Finish  time: ____________ °Date: ____________ Movements: ____________ Start time: ____________ Finish time: ____________ °Date: ____________ Movements: ____________ Start time: ____________ Finish time: ____________ °Date: ____________ Movements: ____________ Start time: ____________ Finish time: ____________ °Date: ____________ Movements: ____________ Start time: ____________ Finish time: ____________ °Date: ____________ Movements: ____________ Start time: ____________ Finish time: ____________  °Date: ____________ Movements: ____________ Start time: ____________ Finish   time: ____________ Date: ____________ Movements: ____________ Start time: ____________ Erin Ellis time: ____________ Date: ____________ Movements: ____________ Start time: ____________ Erin Ellis time: ____________ Date: ____________ Movements: ____________ Start time: ____________ Erin Ellis time: ____________ Date: ____________ Movements: ____________ Start time: ____________ Erin Ellis time: ____________ Date: ____________ Movements: ____________ Start time: ____________ Erin Ellis time: ____________ Date: ____________ Movements: ____________ Start time: ____________ Erin Ellis time: ____________  Date: ____________ Movements: ____________ Start time: ____________ Erin Ellis time: ____________ Date: ____________ Movements: ____________ Start time: ____________ Erin Ellis time: ____________ Date: ____________ Movements: ____________ Start time: ____________ Erin Ellis time: ____________ Date: ____________ Movements: ____________ Start time: ____________ Erin Ellis time: ____________ Date: ____________ Movements: ____________ Start time: ____________ Erin Ellis time: ____________ Date: ____________ Movements: ____________ Start time: ____________ Erin Ellis time: ____________ Date: ____________ Movements: ____________ Start time: ____________ Erin Ellis time: ____________  Date: ____________ Movements: ____________ Start time: ____________ Erin Ellis time: ____________ Date: ____________  Movements: ____________ Start time: ____________ Erin Ellis time: ____________ Date: ____________ Movements: ____________ Start time: ____________ Erin Ellis time: ____________ Date: ____________ Movements: ____________ Start time: ____________ Erin Ellis time: ____________ Date: ____________ Movements: ____________ Start time: ____________ Erin Ellis time: ____________ Date: ____________ Movements: ____________ Start time: ____________ Erin Ellis time: ____________ Date: ____________ Movements: ____________ Start time: ____________ Erin Ellis time: ____________  Date: ____________ Movements: ____________ Start time: ____________ Erin Ellis time: ____________ Date: ____________ Movements: ____________ Start time: ____________ Erin Ellis time: ____________ Date: ____________ Movements: ____________ Start time: ____________ Erin Ellis time: ____________ Date: ____________ Movements: ____________ Start time: ____________ Erin Ellis time: ____________ Date: ____________ Movements: ____________ Start time: ____________ Erin Ellis time: ____________ Date: ____________ Movements: ____________ Start time: ____________ Erin Ellis time: ____________   This information is not intended to replace advice given to you by your health care provider. Make sure you discuss any questions you have with your health care provider.   Document Released: 04/10/2006 Document Revised: 04/01/2014 Document Reviewed: 01/06/2012 Elsevier Interactive Patient Education 2016 [employer] PRETERM LABOR: Includes any of the following symptoms that occur between 20-[redacted] weeks gestation. If these symptoms are not stopped, preterm labor can result in preterm delivery, placing your baby at risk.  Notify your doctor if any of the following occur: 1. Menstrual-like cramps   5. Pelvic pressure  2. Uterine contractions. These may be painless and feel like the uterus is tightening or the baby is "balling up" 6. Increase or change in vaginal discharge  3. Low, dull backache, unrelieved  by heat or Tylenol  7. Vaginal bleeding  4. Intestinal cramps, with our without diarrhea, sometimes 8. A general feeling that "something is not right"   9. Leaking of fluid described as "gas pain"

## 2015-10-22 ENCOUNTER — Emergency Department: Payer: Medicaid Other

## 2015-10-22 ENCOUNTER — Encounter: Payer: Self-pay | Admitting: Emergency Medicine

## 2015-10-22 ENCOUNTER — Inpatient Hospital Stay
Admission: EM | Admit: 2015-10-22 | Discharge: 2015-10-23 | Disposition: A | Discharge status: Home / Self Care | Payer: Medicaid Other | Attending: Emergency Medicine | Admitting: Emergency Medicine

## 2015-10-22 DIAGNOSIS — Z3A31 31 weeks gestation of pregnancy: Secondary | ICD-10-CM | POA: Diagnosis not present

## 2015-10-22 DIAGNOSIS — Z87891 Personal history of nicotine dependence: Secondary | ICD-10-CM | POA: Diagnosis not present

## 2015-10-22 DIAGNOSIS — O219 Vomiting of pregnancy, unspecified: Secondary | ICD-10-CM | POA: Insufficient documentation

## 2015-10-22 DIAGNOSIS — R1011 Right upper quadrant pain: Secondary | ICD-10-CM | POA: Insufficient documentation

## 2015-10-22 DIAGNOSIS — R110 Nausea: Secondary | ICD-10-CM

## 2015-10-22 HISTORY — DX: Hypokalemia: E87.6

## 2015-10-22 LAB — COMPREHENSIVE METABOLIC PANEL
ALK PHOS: 98 U/L (ref 38–126)
ALT: 10 U/L — AB (ref 14–54)
AST: 16 U/L (ref 15–41)
Albumin: 3.1 g/dL — ABNORMAL LOW (ref 3.5–5.0)
Anion gap: 9 (ref 5–15)
BUN: 6 mg/dL (ref 6–20)
CALCIUM: 8.6 mg/dL — AB (ref 8.9–10.3)
CHLORIDE: 108 mmol/L (ref 101–111)
CO2: 20 mmol/L — ABNORMAL LOW (ref 22–32)
CREATININE: 0.44 mg/dL (ref 0.44–1.00)
Glucose, Bld: 88 mg/dL (ref 65–99)
Potassium: 3.3 mmol/L — ABNORMAL LOW (ref 3.5–5.1)
Sodium: 137 mmol/L (ref 135–145)
Total Bilirubin: 1.1 mg/dL (ref 0.3–1.2)
Total Protein: 6.5 g/dL (ref 6.5–8.1)

## 2015-10-22 LAB — CBC WITH DIFFERENTIAL/PLATELET
BASOS ABS: 0 10*3/uL (ref 0–0.1)
Basophils Relative: 0 %
Eosinophils Absolute: 0.1 10*3/uL (ref 0–0.7)
Eosinophils Relative: 1 %
HEMATOCRIT: 38 % (ref 35.0–47.0)
HEMOGLOBIN: 13.2 g/dL (ref 12.0–16.0)
LYMPHS ABS: 0.4 10*3/uL — AB (ref 1.0–3.6)
LYMPHS PCT: 2 %
MCH: 31.8 pg (ref 26.0–34.0)
MCHC: 34.6 g/dL (ref 32.0–36.0)
MCV: 91.7 fL (ref 80.0–100.0)
Monocytes Absolute: 0.4 10*3/uL (ref 0.2–0.9)
Monocytes Relative: 3 %
NEUTROS ABS: 14.3 10*3/uL — AB (ref 1.4–6.5)
NEUTROS PCT: 94 %
PLATELETS: 175 10*3/uL (ref 150–440)
RBC: 4.14 MIL/uL (ref 3.80–5.20)
RDW: 12.8 % (ref 11.5–14.5)
WBC: 15.2 10*3/uL — AB (ref 3.6–11.0)

## 2015-10-22 LAB — URINALYSIS COMPLETE WITH MICROSCOPIC (ARMC ONLY)
Bilirubin Urine: NEGATIVE
Glucose, UA: NEGATIVE mg/dL
Hgb urine dipstick: NEGATIVE
Leukocytes, UA: NEGATIVE
Nitrite: NEGATIVE
PH: 5 (ref 5.0–8.0)
PROTEIN: 30 mg/dL — AB
Specific Gravity, Urine: 1.024 (ref 1.005–1.030)

## 2015-10-22 LAB — LIPASE, BLOOD: LIPASE: 13 U/L (ref 11–51)

## 2015-10-22 MED ORDER — ONDANSETRON 4 MG PO TBDP: 4.0000 mg | ORAL_TABLET | Freq: Four times a day (QID) | ORAL | 0 refills | Status: DC | PRN

## 2015-10-22 MED ORDER — ACETAMINOPHEN 325 MG PO TABS
650.0000 mg | ORAL_TABLET | Freq: Once | ORAL | Status: AC
  Administered 2015-10-22: 650 mg via ORAL
  Filled 2015-10-22: qty 2

## 2015-10-22 MED ORDER — HYDROCODONE-ACETAMINOPHEN 5-325 MG PO TABS
1.0000 | ORAL_TABLET | Freq: Once | ORAL | Status: AC
  Administered 2015-10-22: 1 via ORAL
  Filled 2015-10-22: qty 1

## 2015-10-22 MED ORDER — SODIUM CHLORIDE FLUSH 0.9 % IV SOLN
INTRAVENOUS | Status: AC
  Filled 2015-10-22: qty 20

## 2015-10-22 MED ORDER — CYCLOBENZAPRINE HCL 10 MG PO TABS: 10.0000 mg | ORAL_TABLET | Freq: Once | ORAL | Status: DC

## 2015-10-22 MED ORDER — CYCLOBENZAPRINE HCL 10 MG PO TABS: 10.0000 mg | ORAL_TABLET | Freq: Three times a day (TID) | ORAL | 2 refills | Status: DC | PRN

## 2015-10-22 MED ORDER — PROMETHAZINE HCL 25 MG/ML IJ SOLN
6.2500 mg | Freq: Once | INTRAMUSCULAR | Status: AC
  Administered 2015-10-22: 6.25 mg via INTRAVENOUS
  Filled 2015-10-22: qty 1

## 2015-10-22 MED ORDER — HYDROCODONE-ACETAMINOPHEN 5-325 MG PO TABS: 1.0000 | ORAL_TABLET | Freq: Four times a day (QID) | ORAL | 0 refills | Status: DC | PRN

## 2015-10-22 MED ORDER — PROMETHAZINE HCL 25 MG PO TABS: 25.0000 mg | ORAL_TABLET | Freq: Four times a day (QID) | ORAL | 2 refills | Status: DC | PRN

## 2015-10-22 MED ORDER — POTASSIUM CHLORIDE CRYS ER 20 MEQ PO TBCR
20.0000 meq | EXTENDED_RELEASE_TABLET | Freq: Once | ORAL | Status: AC
  Administered 2015-10-22: 20 meq via ORAL
  Filled 2015-10-22: qty 1

## 2015-10-22 MED ORDER — SODIUM CHLORIDE 0.9 % IV BOLUS (SEPSIS)
1000.0000 mL | Freq: Once | INTRAVENOUS | Status: AC
  Administered 2015-10-22: 1000 mL via INTRAVENOUS

## 2015-10-22 NOTE — ED Notes (Signed)
Patient left for ultrasound.

## 2015-10-22 NOTE — ED Notes (Signed)
This RN Spoke with Erin Ellis on L&D and it is requested that patient be seen by EDP prior to transport upstairs. CN Tammy Sours Notified

## 2015-10-22 NOTE — ED Triage Notes (Signed)
Patient is currently [redacted] weeks pregnant, with c/o n/v and upper abdominal pain. Denies contractions, denies amniotic leak.

## 2015-10-22 NOTE — Final Progress Note (Signed)
Physician Final Progress Note  Patient ID: Erin Ellis MRN: [id number] DOB/AGE: 19-Mar-1991 25 y.o.  Admit date: 10/22/2015 Admitting provider: Christeen Douglas, MD Discharge date: 10/22/2015   Improvement in nausea sx, tolerating regular diet. PO meds for pain and muscle relief with improvement in sx.   Scripts given (vicodin #10, zofran, phenergen, flexeril)   SignedChristeen Douglas 10/22/2015, 9:22 PM

## 2015-10-22 NOTE — Progress Notes (Signed)
Dr Bernestine Amass stating EXT monitor to be removed

## 2015-10-22 NOTE — ED Notes (Signed)
Patient states she has felt the baby move as usual today.

## 2015-10-22 NOTE — Progress Notes (Signed)
Dr Dalbert Garnet given update, order received for antiemetic IV as pt still with [facility]. Dr states if pt becomes more comfortable with antiemetic and able to tolerate medication as needed, may d/c home with her analgesic prescription. States she has phoned pt's pharmacy with other scripts

## 2015-10-22 NOTE — ED Provider Notes (Signed)
Time Seen: Approximately *1637  I have reviewed the triage notes  Chief Complaint: Nausea and Emesis   History of Present Illness: Erin Ellis is a 25 y.o. female who is gravida 6 para 2 who presents at approximately [redacted] weeks pregnant with nausea vomiting subjective fever at home and loose stool. He denies any melena or hematochezia. She states last bowel movement was last evening. She vomited just prior to arrival. Clymer similar episodes with this pregnancy in the past. She states that her potassium has tendency to get low. She denies any significant vaginal discharge or bleeding at this time. She denies any dysuria, hematuria or urinary frequency. She is unaware of any melena or hematochezia. States her 2 kids at home had similar episodes of nausea and vomiting 2 days ago. She denies any foodborne exposure. Abdominal pain is rather diffuse nonfocal at this time.   Past Medical History:  Diagnosis Date  . Abortion    In 2015 (8 weeks, 4 days pregnant)  . Anxiety   . Complication of anesthesia   . Depression   . Headache   . Medical history non-contributory   . Miscarriage     Patient Active Problem List   Diagnosis Date Noted  . Labor and delivery affected by abnormality of maternal pelvic organs 09/23/2015  . Threatened preterm labor 09/18/2015  . Abdominal pain affecting pregnancy, antepartum 09/08/2015    Past Surgical History:  Procedure Laterality Date  . ABDOMINAL EXPLORATION SURGERY      Past Surgical History:  Procedure Laterality Date  . ABDOMINAL EXPLORATION SURGERY      Current Outpatient Rx  . Order #: [id number] Class: Historical Med  . Order #: [id number] Class: Historical Med  . Order #: [id number] Class: Historical Med  . Order #: [id number] Class: Print    Allergies:  Sulfa antibiotics  Family History: Family History  Problem Relation Age of Onset  . Hypertension Mother     Social History: Social History  Substance Use Topics  . Smoking  status: Former Smoker    Packs/day: 1.00    Types: Cigarettes    Quit date: 09/11/2015  . Smokeless tobacco: Not on file  . Alcohol use No     Review of Systems:   10 point review of systems was performed and was otherwise negative:  Constitutional: Subjective fever at home Eyes: No visual disturbances ENT: No sore throat, ear pain Cardiac: No chest pain Respiratory: No shortness of breath, wheezing, or stridor Abdomen: Diffuse crampy intermittent abdominal pain associated with some continuous nausea vomiting. Diarrhea has apparently slowed at this time. Endocrine: No weight loss, No night sweats Extremities: No peripheral edema, cyanosis Skin: No rashes, easy bruising Neurologic: No focal weakness, trouble with speech or swollowing Urologic: No dysuria, Hematuria, or urinary frequency   Physical Exam:  ED Triage Vitals  Enc Vitals Group     BP 10/22/15 1404 119/63     Pulse Rate 10/22/15 1404 (!) 103     Resp 10/22/15 1404 18     Temp 10/22/15 1404 98.7 F (37.1 C)     Temp Source 10/22/15 1404 Oral     SpO2 10/22/15 1404 96 %     Weight 10/22/15 1404 133 lb (60.3 kg)     Height 10/22/15 1404 5\' 3"  (1.6 m)     Head Circumference --      Peak Flow --      Pain Score 10/22/15 1354 10     Pain Loc --  Pain Edu? --      Excl. in GC? --     General: Awake , Alert , and Oriented times 3; GCS 15 Head: Normal cephalic , atraumatic Eyes: Pupils equal , round, reactive to light Nose/Throat: No nasal drainage, patent upper airway without erythema or exudate.  Neck: Supple, Full range of motion, No anterior adenopathy or palpable thyroid masses Lungs: Clear to ascultation without wheezes , rhonchi, or rales Heart: Regular rate, regular rhythm without murmurs , gallops , or rubs Abdomen: Diffuse mild tenderness without rebound, guarding , or rigidity; bowel sounds positive and symmetric in all 4 quadrants. No organomegaly .        Extremities: 2 plus symmetric pulses. No  edema, clubbing or cyanosis Neurologic: normal ambulation, Motor symmetric without deficits, sensory intact Skin: warm, dry, no rashes   Labs:   All laboratory work was reviewed including any pertinent negatives or positives listed below:  Labs Reviewed  CBC WITH DIFFERENTIAL/PLATELET - Abnormal; Notable for the following:       Result Value   WBC 15.2 (*)    Neutro Abs 14.3 (*)    Lymphs Abs 0.4 (*)    All other components within normal limits  COMPREHENSIVE METABOLIC PANEL - Abnormal; Notable for the following:    Potassium 3.3 (*)    CO2 20 (*)    Calcium 8.6 (*)    Albumin 3.1 (*)    ALT 10 (*)    All other components within normal limits  LIPASE, BLOOD  URINALYSIS COMPLETEWITH MICROSCOPIC ([facility] ONLY)  Potassium was only slightly low and the patient was given 20 mEq of by mouth potassium   Radiology:  Ultrasound showed gallstones without any actual evidence of acute cholecystitis I personally reviewed the radiologic studies     ED Course: * Patient received IV fluid bolus and was given 6.25 mg of IV Phenergan with symptomatic relief. The nausea has resolved though she still has some right flank and right upper quadrant abdominal pain. She does not appear to have any signs of preeclampsia. The patient was given Tylenol and repeat exam showed the tenderness over the right upper quadrant the patient had an ultrasound which did not show any surgical findings at this time. The patient's case was reviewed with Dr. Dalbert Garnet of the [facility]. Patient will be observed the labor and delivery area for a preterm labors and she still has back and flank pain Fetal heart tones are approximately 150 Clinical Course     Assessment:  Nausea, vomiting Mild hypokalemia Third trimester pregnancy    Plan:  Transfer for observation            Jennye Moccasin, MD 10/22/15 1850

## 2015-10-22 NOTE — OB Triage Provider Note (Addendum)
Triage visit for NST   Erin Ellis is a 25 y.o. [id number]. She is at [redacted]w[redacted]d gestation. She presents for a NST after presenting to the ER for nausea/vomiting. They found gall stones without cholecystitis. She received IV fluids, potassium and iv phenergan with improvement in sx. She does have cramping and right flank pain. It is difficult to lift legs and she feels numbness and tingling down leg  She presents for NST and fetal evaluation.  S: Resting comfortably. no CTX, no VB. Active fetal movement.  O:  BP (!) 103/57 (BP Location: Left Arm)   Pulse 98   Temp 98.2 F (36.8 C) (Oral)   Resp 20   Ht 5\' 3"  (1.6 m)   Wt 133 lb (60.3 kg)   LMP 03/19/2015   SpO2 96%   BMI 23.56 kg/m  Results for orders placed or performed during the hospital encounter of 10/22/15 (from the past 48 hour(s))  CBC with Differential   Collection Time: 10/22/15  2:41 PM  Result Value Ref Range   WBC 15.2 (H) 3.6 - 11.0 K/uL   RBC 4.14 3.80 - 5.20 MIL/uL   Hemoglobin 13.2 12.0 - 16.0 g/dL   HCT 14.7 82.9 - 56.2 %   MCV 91.7 80.0 - 100.0 fL   MCH 31.8 26.0 - 34.0 pg   MCHC 34.6 32.0 - 36.0 g/dL   RDW 13.0 86.5 - 78.4 %   Platelets 175 150 - 440 K/uL   Neutrophils Relative % 94 %   Neutro Abs 14.3 (H) 1.4 - 6.5 K/uL   Lymphocytes Relative 2 %   Lymphs Abs 0.4 (L) 1.0 - 3.6 K/uL   Monocytes Relative 3 %   Monocytes Absolute 0.4 0.2 - 0.9 K/uL   Eosinophils Relative 1 %   Eosinophils Absolute 0.1 0 - 0.7 K/uL   Basophils Relative 0 %   Basophils Absolute 0.0 0 - 0.1 K/uL  Comprehensive metabolic panel   Collection Time: 10/22/15  2:41 PM  Result Value Ref Range   Sodium 137 135 - 145 mmol/L   Potassium 3.3 (L) 3.5 - 5.1 mmol/L   Chloride 108 101 - 111 mmol/L   CO2 20 (L) 22 - 32 mmol/L   Glucose, Bld 88 65 - 99 mg/dL   BUN 6 6 - 20 mg/dL   Creatinine, Ser 6.96 0.44 - 1.00 mg/dL   Calcium 8.6 (L) 8.9 - 10.3 mg/dL   Total Protein 6.5 6.5 - 8.1 g/dL   Albumin 3.1 (L) 3.5 - 5.0 g/dL   AST 16  15 - 41 U/L   ALT 10 (L) 14 - 54 U/L   Alkaline Phosphatase 98 38 - 126 U/L   Total Bilirubin 1.1 0.3 - 1.2 mg/dL   GFR calc non Af Amer >60 >60 mL/min   GFR calc Af Amer >60 >60 mL/min   Anion gap 9 5 - 15  Lipase, blood   Collection Time: 10/22/15  2:41 PM  Result Value Ref Range   Lipase 13 11 - 51 U/L  Urinalysis complete, with microscopic ([facility] only)   Collection Time: 10/22/15  4:50 PM  Result Value Ref Range   Color, Urine YELLOW (A) YELLOW   APPearance HAZY (A) CLEAR   Glucose, UA NEGATIVE NEGATIVE mg/dL   Bilirubin Urine NEGATIVE NEGATIVE   Ketones, ur 2+ (A) NEGATIVE mg/dL   Specific Gravity, Urine 1.024 1.005 - 1.030   Hgb urine dipstick NEGATIVE NEGATIVE   pH 5.0 5.0 - 8.0  Protein, ur 30 (A) NEGATIVE mg/dL   Nitrite NEGATIVE NEGATIVE   Leukocytes, UA NEGATIVE NEGATIVE   RBC / HPF 0-5 0 - 5 RBC/hpf   WBC, UA 0-5 0 - 5 WBC/hpf   Bacteria, UA RARE (A) NONE SEEN   Squamous Epithelial / LPF 6-30 (A) NONE SEEN   Mucous PRESENT      Gen: NAD, AAOx3      Abd: FNTTP  Right flank and buttocks: Generalized tenderness to light touch. No CVA tenderness. No uterine tenderness. Pt is moving significantly but does not appear to be gall bladder related or RUQ pain. + striaight leg test positive.      Ext: Non-tender, Nonedmeatous    FHT: 150, mod var, +accels (15x15) and no decels TOCO: quiet SVE:   closed/thick and high. No contractions.   A/P:  25 y.o. [id number] [redacted]w[redacted]d with flank pain, nausea.    The ED found ketonuria, leukocytosis, gall stones without cholecystitis. She is s/p treatment for these. Will give script for zofran, phenergen  Right flank pain: Appears to be MSK in origin, possibly with a sciatica component. #tabs10 vicodin, flexeril given.   Reactive NST, with moderate variability and accelerations, no decels  Fetal Wellbeing: Reassuring  D/c home stable, precautions reviewed, follow-up as scheduled.

## 2015-10-23 MED ORDER — LACTATED RINGERS IV SOLN
INTRAVENOUS | Status: DC
  Administered 2015-10-22: 125 mL/h via INTRAVENOUS

## 2015-10-23 NOTE — Discharge Instructions (Signed)
Pt to make Follow Up Appointment for Aug2/ 17, or earlier if needed Pt to perform Fetal Kick Counts daily and prn Pt to continue to hydrate Pt to have spouse pick up medications called into pharmacy, and take  Analgesic prescription in to be filled  Call Dr if oral temperature greater or equal to 100.4 degrees

## 2015-10-26 DIAGNOSIS — M549 Dorsalgia, unspecified: Secondary | ICD-10-CM | POA: Insufficient documentation

## 2015-10-26 DIAGNOSIS — G44229 Chronic tension-type headache, not intractable: Secondary | ICD-10-CM | POA: Insufficient documentation

## 2015-10-26 DIAGNOSIS — O26893 Other specified pregnancy related conditions, third trimester: Secondary | ICD-10-CM | POA: Insufficient documentation

## 2015-10-31 DIAGNOSIS — K831 Obstruction of bile duct: Secondary | ICD-10-CM | POA: Insufficient documentation

## 2015-10-31 DIAGNOSIS — O26619 Liver and biliary tract disorders in pregnancy, unspecified trimester: Secondary | ICD-10-CM

## 2015-11-15 ENCOUNTER — Other Ambulatory Visit: Payer: Self-pay | Admitting: Obstetrics and Gynecology

## 2015-11-22 ENCOUNTER — Inpatient Hospital Stay
Admission: EM | Admit: 2015-11-22 | Discharge: 2015-11-22 | Disposition: A | Discharge status: Home / Self Care | Payer: Medicaid Other | Attending: Obstetrics and Gynecology | Admitting: Obstetrics and Gynecology

## 2015-11-22 HISTORY — DX: Other specified postprocedural states: Z98.890

## 2015-11-22 HISTORY — DX: Nausea with vomiting, unspecified: R11.2

## 2015-11-22 NOTE — Discharge Instructions (Signed)
Cholestasis of Pregnancy °Cholestasis refers to any condition that causes the flow of the digestive fluid (bile) produced by your liver to slow or stop. Cholestasis of pregnancy is most common toward the end of pregnancy (third trimester), but it can occur any time during your pregnancy. The condition often goes away soon after your baby is born.  °Cholestasis may be uncomfortable but is usually harmless to you. However, it can be harmful to your baby. Cholestasis may increase the risk that your baby will be born too early (preterm delivery).  °CAUSES  °The cause of cholestasis of pregnancy is not known. Pregnancy hormones may affect the way your gallbladder functions. Your gallbladder normally holds the bile from your liver until you need it to help digest fat in your diet. Pregnancy hormones may cause the flow of bile to slow down and back up into your liver. Bile may then get into your bloodstream and cause cholestasis symptoms. °RISK FACTORS °You may be at increased risk if: °· You had cholestasis during a previous pregnancy. °· You have a family history of cholestasis. °· You have liver problems. °· You are having twins. °SIGNS AND SYMPTOMS  °The most common symptom of cholestasis of pregnancy is intense itching, especially on the palms of your hands and soles of your feet. The itching can spread to the rest of your body and is often worse at night. You will not usually have a rash. Other symptoms may include:  °· Feeling tired.   °· Yellowish discoloration of your skin and the whites of your eyes (jaundice).   °· Dark-colored urine.   °· Light-colored stools. °· Poor appetite.    °DIAGNOSIS  °Your health care provider will take your medical history and do a physical exam. You may have blood tests to check your liver function, bile level, and bilirubin level.  °TREATMENT  °Treatment is meant to make you more comfortable and keep your baby safe. Your health care provider may prescribe medicine to relieve your  itching. The medicine used may also improve your blood test results and help keep your baby safe. Your health care provider may also give you vitamin K before delivery to prevent excessive bleeding.  °Your health care provider may want to check your baby (fetal monitoring) frequently, as often as every 2 weeks. Once your baby's lungs have developed enough, your health care provider may recommend starting (inducing) your labor and delivery by week 37 of your pregnancy. °HOME CARE INSTRUCTIONS  °· Only use anti-itch creams and take medicines as directed by your health care provider. °· Take cool baths to soothe your itching.   °· Keep your fingernails short to prevent skin irritation from scratching.   °· Keep all your appointments for fetal monitoring. °SEEK MEDICAL CARE IF:  °Your symptoms get worse, even with treatment. °SEEK IMMEDIATE MEDICAL CARE IF: °You go into early labor at home. °MAKE SURE YOU: °· Understand these instructions. °· Will watch your condition. °· Will get help right away if you are not doing well or get worse. °  °This information is not intended to replace advice given to you by your health care provider. Make sure you discuss any questions you have with your health care provider. °  °Document Released: 03/08/2000 Document Revised: 04/01/2014 Document Reviewed: 01/01/2013 °Elsevier Interactive Patient Education ©2016 [employer] ° ° ° °

## 2015-11-22 NOTE — OB Triage Note (Signed)
Erin Ellis sent from office for monitoring for ctx since last night. Pregnancy complicated by cholestasis.

## 2015-11-23 ENCOUNTER — Other Ambulatory Visit: Payer: Self-pay

## 2015-12-04 ENCOUNTER — Ambulatory Visit: Payer: Self-pay | Admitting: Surgery

## 2015-12-05 ENCOUNTER — Other Ambulatory Visit: Payer: Self-pay | Admitting: Obstetrics and Gynecology

## 2015-12-05 ENCOUNTER — Inpatient Hospital Stay
Admission: EM | Admit: 2015-12-05 | Discharge: 2015-12-08 | DRG: 767 | Disposition: A | Discharge status: Home / Self Care | Payer: Medicaid Other | Attending: Obstetrics and Gynecology | Admitting: Obstetrics and Gynecology

## 2015-12-05 DIAGNOSIS — Z3A37 37 weeks gestation of pregnancy: Secondary | ICD-10-CM | POA: Diagnosis not present

## 2015-12-05 DIAGNOSIS — Z87891 Personal history of nicotine dependence: Secondary | ICD-10-CM | POA: Diagnosis not present

## 2015-12-05 DIAGNOSIS — K8080 Other cholelithiasis without obstruction: Secondary | ICD-10-CM | POA: Diagnosis present

## 2015-12-05 DIAGNOSIS — Z8249 Family history of ischemic heart disease and other diseases of the circulatory system: Secondary | ICD-10-CM | POA: Diagnosis not present

## 2015-12-05 DIAGNOSIS — Z79899 Other long term (current) drug therapy: Secondary | ICD-10-CM

## 2015-12-05 DIAGNOSIS — Z302 Encounter for sterilization: Secondary | ICD-10-CM

## 2015-12-05 DIAGNOSIS — F419 Anxiety disorder, unspecified: Secondary | ICD-10-CM | POA: Diagnosis present

## 2015-12-05 DIAGNOSIS — K831 Obstruction of bile duct: Secondary | ICD-10-CM | POA: Diagnosis present

## 2015-12-05 DIAGNOSIS — O26613 Liver and biliary tract disorders in pregnancy, third trimester: Secondary | ICD-10-CM

## 2015-12-05 DIAGNOSIS — O99333 Smoking (tobacco) complicating pregnancy, third trimester: Secondary | ICD-10-CM | POA: Diagnosis present

## 2015-12-05 DIAGNOSIS — O99343 Other mental disorders complicating pregnancy, third trimester: Secondary | ICD-10-CM | POA: Diagnosis present

## 2015-12-05 DIAGNOSIS — F329 Major depressive disorder, single episode, unspecified: Secondary | ICD-10-CM | POA: Diagnosis present

## 2015-12-05 DIAGNOSIS — O2662 Liver and biliary tract disorders in childbirth: Secondary | ICD-10-CM | POA: Diagnosis present

## 2015-12-05 LAB — CBC
HCT: 32.7 % — ABNORMAL LOW (ref 35.0–47.0)
Hemoglobin: 11.6 g/dL — ABNORMAL LOW (ref 12.0–16.0)
MCH: 30.9 pg (ref 26.0–34.0)
MCHC: 35.4 g/dL (ref 32.0–36.0)
MCV: 87.3 fL (ref 80.0–100.0)
PLATELETS: 186 10*3/uL (ref 150–440)
RBC: 3.74 MIL/uL — ABNORMAL LOW (ref 3.80–5.20)
RDW: 13.3 % (ref 11.5–14.5)
WBC: 11.4 10*3/uL — ABNORMAL HIGH (ref 3.6–11.0)

## 2015-12-05 LAB — TYPE AND SCREEN
ABO/RH(D): A POS
Antibody Screen: NEGATIVE

## 2015-12-05 MED ORDER — DINOPROSTONE 10 MG VA INST
10.0000 mg | VAGINAL_INSERT | Freq: Once | VAGINAL | Status: AC
  Administered 2015-12-05: 10 mg via VAGINAL
  Filled 2015-12-05: qty 1

## 2015-12-05 MED ORDER — LIDOCAINE HCL (PF) 1 % IJ SOLN: 30.0000 mL | INTRAMUSCULAR | Status: DC | PRN

## 2015-12-05 MED ORDER — OXYTOCIN 40 UNITS IN LACTATED RINGERS INFUSION - SIMPLE MED
2.5000 [IU]/h | INTRAVENOUS | Status: DC
  Filled 2015-12-05: qty 1000

## 2015-12-05 MED ORDER — ACETAMINOPHEN 325 MG PO TABS: 650.0000 mg | ORAL_TABLET | ORAL | Status: DC | PRN

## 2015-12-05 MED ORDER — LACTATED RINGERS IV SOLN
INTRAVENOUS | Status: DC
  Administered 2015-12-05: 21:00:00 via INTRAVENOUS
  Administered 2015-12-06: 1000 mL via INTRAVENOUS
  Administered 2015-12-06 – 2015-12-07 (×2): via INTRAVENOUS

## 2015-12-05 MED ORDER — SOD CITRATE-CITRIC ACID 500-334 MG/5ML PO SOLN
30.0000 mL | ORAL | Status: DC | PRN
  Filled 2015-12-05: qty 30

## 2015-12-05 MED ORDER — ZOLPIDEM TARTRATE 5 MG PO TABS
5.0000 mg | ORAL_TABLET | Freq: Every evening | ORAL | Status: DC | PRN
  Administered 2015-12-05: 5 mg via ORAL
  Filled 2015-12-05: qty 1

## 2015-12-05 MED ORDER — LACTATED RINGERS IV SOLN: 500.0000 mL | INTRAVENOUS | Status: DC | PRN

## 2015-12-05 MED ORDER — ONDANSETRON HCL 4 MG/2ML IJ SOLN
4.0000 mg | Freq: Four times a day (QID) | INTRAMUSCULAR | Status: DC | PRN
  Administered 2015-12-06: 4 mg via INTRAVENOUS
  Filled 2015-12-05: qty 2

## 2015-12-05 MED ORDER — OXYTOCIN BOLUS FROM INFUSION: 500.0000 mL | Freq: Once | INTRAVENOUS | Status: DC

## 2015-12-05 MED ORDER — BUTORPHANOL TARTRATE 1 MG/ML IJ SOLN: 1.0000 mg | INTRAMUSCULAR | Status: DC | PRN

## 2015-12-05 MED ORDER — TERBUTALINE SULFATE 1 MG/ML IJ SOLN: 0.2500 mg | Freq: Once | INTRAMUSCULAR | Status: DC | PRN

## 2015-12-05 NOTE — H&P (Signed)
OB ADMISSION/ HISTORY & PHYSICAL:  Admission Date: 12/05/15  Admit Diagnosis: Induction of Labor for Cholestasis of pregnancy  Erin Ellis is a 25 y.o. female G5P2 at 37+2 weeks presenting for induction of labor for cholestasis of pregnancy.  She has been on Ursodiol 300mg  TID.    Prenatal History: [id number]   EDC : 12/24/2015, by exact Last Menstrual Period of 03/19/15 Prenatal care at [facility] Prenatal course complicated by: Cholestasis of pregnancy on Ursodiol, tobacco use, but quit 09/14/15, migraines and using Fioricet PRN, anxiety and depression on Wellbutrin 150mg  XL, gallstones without cholecystitis   Prenatal Labs: ABO, Rh:  A Positive Antibody:  Negative Rubella: Immune (05/11 0000)  RPR: Nonreactive (05/11 0000)  HBsAg: Negative (05/11 0000)  HIV: Non-reactive (03/13 0000)  GTT: 131 GBS:   Negative  1st trimester screen: Negative AFP: negative   5 Term 03/26/11  3.572 kg (7 lb 14 oz) M [facility] [place], [place]    4 Term 12/14/07  3.827 kg (8 lb 7 oz) F Vag-Spont  Living [facility] Acquanetta Belling, CNM    Medical / Surgical History :  Past medical history:  Past Medical History:  Diagnosis Date  . Abortion    In 2015 (8 weeks, 4 days pregnant)  . Anxiety   . Complication of anesthesia   . Depression    anxiety, depression- on Wellbutrtin   . Headache    sees a neurologist on Aug 3, occurrs everday whole head, even without preg  . Low blood potassium 07/2015   treated with Potassium in ER  . Medical history non-contributory   . Miscarriage   . PONV (postoperative nausea and vomiting)      Past surgical history:  Past Surgical History:  Procedure Laterality Date  . ABDOMINAL EXPLORATION SURGERY      Family History:  Family History  Problem Relation Age of Onset  . Hypertension Mother   . Mental illness Mother   . Alcohol abuse Father   . Mental illness Father      Social History:  reports that she quit  smoking about 2 months ago. Her smoking use included Cigarettes. She smoked 1.00 pack per day. She has never used smokeless tobacco. She reports that she does not drink alcohol or use drugs.   Allergies: Sulfa antibiotics    Current Medications at time of admission:  Prior to Admission medications   Medication Sig Start Date End Date Taking? Authorizing Provider  buPROPion (WELLBUTRIN SR) 150 MG 12 hr tablet Take 150 mg by mouth 2 (two) times daily.    Historical Provider, MD  buPROPion (WELLBUTRIN XL) 150 MG 24 hr tablet Take by mouth. 09/01/15   Historical Provider, MD  Butalbital-APAP-Caffeine 50-325-40 MG capsule Take 1 capsule by mouth every 4 (four) hours as needed for pain.    Historical Provider, MD  Butalbital-APAP-Caffeine [phone] MG capsule Take by mouth. 09/20/15   Historical Provider, MD  cyclobenzaprine (FLEXERIL) 10 MG tablet Take 1 tablet (10 mg total) by mouth 3 (three) times daily as needed for muscle spasms. 10/22/15   Christeen Douglas, MD  cyclobenzaprine (FLEXERIL) 10 MG tablet Take by mouth. 10/22/15   Historical Provider, MD  HYDROcodone-acetaminophen (NORCO/VICODIN) 5-325 MG tablet Take 1 tablet by mouth every 6 (six) hours as needed. 10/22/15   Christeen Douglas, MD  IRON PO Take by mouth.    Historical Provider, MD  metroNIDAZOLE (FLAGYL) 500 MG tablet Take by mouth. 10/25/15   Historical  Provider, MD  nitrofurantoin, macrocrystal-monohydrate, (MACROBID) 100 MG capsule Take by mouth. 10/25/15   Historical Provider, MD  ondansetron (ZOFRAN ODT) 4 MG disintegrating tablet Take 1 tablet (4 mg total) by mouth every 6 (six) hours as needed for nausea. 10/22/15   Christeen DouglasBethany Beasley, MD  potassium chloride (K-DUR,KLOR-CON) 10 MEQ tablet Take by mouth. 10/31/15 10/30/16  Historical Provider, MD  Prenatal Vit-Fe Fumarate-FA (PRENATAL MULTIVITAMIN) TABS tablet Take 1 tablet by mouth daily at 12 noon.    Historical Provider, MD  promethazine (PHENERGAN) 25 MG tablet Take 1 tablet (25 mg total) by  mouth every 6 (six) hours as needed for nausea or vomiting. 10/22/15   Christeen DouglasBethany Beasley, MD  promethazine (PHENERGAN) 25 MG tablet Take by mouth. 09/20/15   Historical Provider, MD  ranitidine (ZANTAC) 150 MG tablet Take 1 tablet (150 mg total) by mouth 2 (two) times daily. Patient not taking: Reported on 09/18/2015 09/08/15   Karena AddisonMeredith C Sigmon, CNM  ursodiol (ACTIGALL) 300 MG capsule Take 300 mg by mouth 3 (three) times daily.    Historical Provider, MD  ursodiol (ACTIGALL) 300 MG capsule Take by mouth. 10/30/15 10/29/16  Historical Provider, MD     Review of Systems: Active FM Irregular contractions  No LOF  / SROM  No bloody show    Physical Exam:  VS: Last menstrual period 03/19/2015.  General: alert and oriented, appears calm Heart: RRR Lungs: Clear lung fields Abdomen: Gravid, soft and non-tender, non-distended / uterus: gravid, non-tender Extremities: no edema  Genitalia / VE:  FTP-1cm/thick  Assessment: 37+[redacted] weeks gestation Induction stage of labor Cholestasis of Pregnancy  Plan:  1. Admit to Birth Place for Induction    - Routine labor and delivery orders    - Cervidil 10mg  vaginally x 1    - May have Stadol 1mg  every 1hour PRN for pain    - May have epidural >3cm  2. GBS Negative 3. Contraception   - Planning BTL - Medicaid papers signed 09/25/15 4. Anticipate NSVD    - Proven Pelvis 8#7oz  Dr. Feliberto GottronSchermerhorn notified of admission / plan of care  Carlean JewsMeredith Sigmon, CNM

## 2015-12-06 ENCOUNTER — Inpatient Hospital Stay: Payer: Medicaid Other | Admitting: Certified Registered"

## 2015-12-06 MED ORDER — FENTANYL 2.5 MCG/ML W/ROPIVACAINE 0.2% IN NS 100 ML EPIDURAL INFUSION (ARMC-ANES)
EPIDURAL | Status: DC | PRN
  Administered 2015-12-06: 9 mL/h via EPIDURAL

## 2015-12-06 MED ORDER — DIBUCAINE 1 % RE OINT: 1.0000 "application " | TOPICAL_OINTMENT | RECTAL | Status: DC | PRN

## 2015-12-06 MED ORDER — OXYTOCIN 40 UNITS IN LACTATED RINGERS INFUSION - SIMPLE MED
1.0000 m[IU]/min | INTRAVENOUS | Status: DC
  Administered 2015-12-06: 1 m[IU]/min via INTRAVENOUS

## 2015-12-06 MED ORDER — COCONUT OIL OIL
1.0000 "application " | TOPICAL_OIL | Status: DC | PRN
  Administered 2015-12-07: 1 via TOPICAL
  Filled 2015-12-06: qty 120

## 2015-12-06 MED ORDER — IBUPROFEN 600 MG PO TABS
600.0000 mg | ORAL_TABLET | Freq: Four times a day (QID) | ORAL | Status: DC
  Administered 2015-12-06: 600 mg via ORAL

## 2015-12-06 MED ORDER — BENZOCAINE-MENTHOL 20-0.5 % EX AERO: 1.0000 "application " | INHALATION_SPRAY | CUTANEOUS | Status: DC | PRN

## 2015-12-06 MED ORDER — SENNOSIDES-DOCUSATE SODIUM 8.6-50 MG PO TABS: 2.0000 | ORAL_TABLET | ORAL | Status: DC

## 2015-12-06 MED ORDER — OXYTOCIN 10 UNIT/ML IJ SOLN
INTRAMUSCULAR | Status: AC
  Filled 2015-12-06: qty 2

## 2015-12-06 MED ORDER — LIDOCAINE HCL (PF) 1 % IJ SOLN
INTRAMUSCULAR | Status: DC | PRN
  Administered 2015-12-06: 3 mL via SUBCUTANEOUS

## 2015-12-06 MED ORDER — SODIUM CHLORIDE FLUSH 0.9 % IV SOLN
INTRAVENOUS | Status: AC
  Administered 2015-12-06: 10 mL
  Filled 2015-12-06: qty 10

## 2015-12-06 MED ORDER — FENTANYL 2.5 MCG/ML W/ROPIVACAINE 0.2% IN NS 100 ML EPIDURAL INFUSION (ARMC-ANES)
EPIDURAL | Status: AC
  Filled 2015-12-06: qty 100

## 2015-12-06 MED ORDER — AMMONIA AROMATIC IN INHA
RESPIRATORY_TRACT | Status: AC
  Filled 2015-12-06: qty 10

## 2015-12-06 MED ORDER — IBUPROFEN 600 MG PO TABS
600.0000 mg | ORAL_TABLET | Freq: Four times a day (QID) | ORAL | Status: DC
  Administered 2015-12-07 – 2015-12-08 (×5): 600 mg via ORAL
  Filled 2015-12-06: qty 1
  Filled 2015-12-06: qty 2
  Filled 2015-12-06 (×3): qty 1

## 2015-12-06 MED ORDER — ACETAMINOPHEN 325 MG PO TABS: 650.0000 mg | ORAL_TABLET | ORAL | Status: DC | PRN

## 2015-12-06 MED ORDER — ONDANSETRON HCL 4 MG PO TABS
4.0000 mg | ORAL_TABLET | ORAL | Status: DC | PRN
  Administered 2015-12-07: 4 mg via ORAL
  Filled 2015-12-06: qty 1

## 2015-12-06 MED ORDER — LIDOCAINE-EPINEPHRINE (PF) 1.5 %-1:200000 IJ SOLN
INTRAMUSCULAR | Status: DC | PRN
  Administered 2015-12-06: 3 mL via EPIDURAL

## 2015-12-06 MED ORDER — DIPHENHYDRAMINE HCL 25 MG PO CAPS: 25.0000 mg | ORAL_CAPSULE | Freq: Four times a day (QID) | ORAL | Status: DC | PRN

## 2015-12-06 MED ORDER — SIMETHICONE 80 MG PO CHEW: 80.0000 mg | CHEWABLE_TABLET | ORAL | Status: DC | PRN

## 2015-12-06 MED ORDER — FERROUS SULFATE 325 (65 FE) MG PO TABS
325.0000 mg | ORAL_TABLET | Freq: Two times a day (BID) | ORAL | Status: DC
  Administered 2015-12-07 – 2015-12-08 (×2): 325 mg via ORAL
  Filled 2015-12-06 (×2): qty 1

## 2015-12-06 MED ORDER — PRENATAL MULTIVITAMIN CH: 1.0000 | ORAL_TABLET | Freq: Every day | ORAL | Status: DC

## 2015-12-06 MED ORDER — ONDANSETRON HCL 4 MG/2ML IJ SOLN: 4.0000 mg | INTRAMUSCULAR | Status: DC | PRN

## 2015-12-06 MED ORDER — BUPIVACAINE HCL (PF) 0.25 % IJ SOLN
INTRAMUSCULAR | Status: DC | PRN
  Administered 2015-12-06: 5 mL via EPIDURAL

## 2015-12-06 MED ORDER — WITCH HAZEL-GLYCERIN EX PADS: 1.0000 "application " | MEDICATED_PAD | CUTANEOUS | Status: DC | PRN

## 2015-12-06 MED ORDER — LIDOCAINE HCL (PF) 1 % IJ SOLN
INTRAMUSCULAR | Status: AC
  Filled 2015-12-06: qty 30

## 2015-12-06 MED ORDER — OXYCODONE-ACETAMINOPHEN 5-325 MG PO TABS
2.0000 | ORAL_TABLET | ORAL | Status: DC | PRN
  Administered 2015-12-07 – 2015-12-08 (×3): 2 via ORAL
  Filled 2015-12-06 (×3): qty 2

## 2015-12-06 MED ORDER — TERBUTALINE SULFATE 1 MG/ML IJ SOLN: 0.2500 mg | Freq: Once | INTRAMUSCULAR | Status: DC | PRN

## 2015-12-06 MED ORDER — OXYCODONE-ACETAMINOPHEN 5-325 MG PO TABS
1.0000 | ORAL_TABLET | ORAL | Status: DC | PRN
  Administered 2015-12-06: 1 via ORAL
  Filled 2015-12-06: qty 1

## 2015-12-06 MED ORDER — MISOPROSTOL 200 MCG PO TABS
ORAL_TABLET | ORAL | Status: AC
  Filled 2015-12-06: qty 4

## 2015-12-06 NOTE — Progress Notes (Signed)
S:  At 0930, I pulled the Cervidil      Pt. Is 3cm/60-70%/-2  O:  VS: Blood pressure 111/67, pulse (!) 105, temperature 97.9 F (36.6 C), temperature source Oral, resp. rate 18, height 5\' 3"  (1.6 m), weight 64.9 kg (143 lb), last menstrual period 03/19/2015.        FHR : baseline 150 / variability moderate / accelerations + / no decelerations        Toco: contractions every 2-3 minutes / mild-moderate         Cervix : 3cm/60-70%/-2/vtx        Membranes: intact  A: Latent labor     FHR category 1  P: May shower     Plan to begin Pitocin 30 minutes after Cervidil    Reassess in 1-2 hours for AROM    Anticipate NSVD    Dr. Dalbert GarnetBeasley is my back-up physician, aware and agrees with plan of care  Carlean JewsMeredith Jovian Lembcke, CNM

## 2015-12-06 NOTE — Progress Notes (Signed)
Hardie Shackleton, RN assume care of patient, report received.

## 2015-12-06 NOTE — Anesthesia Procedure Notes (Signed)
Epidural Patient location during procedure: OB  Staffing Anesthesiologist: Naomie Dean Resident/CRNA: Mathews Argyle Performed: resident/CRNA   Preanesthetic Checklist Completed: patient identified, site marked, surgical consent, pre-op evaluation, timeout performed, IV checked, risks and benefits discussed and monitors and equipment checked  Epidural Patient position: sitting Prep: ChloraPrep Patient monitoring: heart rate, continuous pulse ox and blood pressure Approach: midline Location: L4-L5 Injection technique: LOR saline  Needle:  Needle type: Tuohy  Needle gauge: 17 G Needle length: 9 cm Needle insertion depth: 7 cm Catheter type: closed end flexible Catheter size: 19 Gauge Catheter at skin depth: 12 cm Test dose: negative and 1.5% lidocaine with Epi 1:200 K  Assessment Events: blood not aspirated, injection not painful, no injection resistance, negative IV test and no paresthesia  Additional Notes   Patient tolerated the insertion well without complications.Reason for block:procedure for pain

## 2015-12-06 NOTE — Progress Notes (Signed)
S:  Patient breathing well through contractions and requesting epidural soon     Discussed AROM and patient agrees  O:  VS: Blood pressure 127/81, pulse 87, temperature 98.8 F (37.1 C), resp. rate 18, height 5\' 3"  (1.6 m), weight 64.9 kg (143 lb), last menstrual period 03/19/2015.        FHR : baseline 155 bpm / variability moderate / accelerations + / occasional variable deceleration with good return to baseline         Toco: contractions every 1-2.5 minutes / moderate         Cervix : Dilation: 4 Effacement (%): 70 Station: -1 Presentation: Vertex Exam by:: Sigmon, CNM        Membranes: AROM - clear fluid   A: Latent labor     FHR category 2  P: May have epidural anytime     Continue expectant management     Hold on Pitocin as contraction pattern is adequate     Anticipate NSVD  Carlean JewsMeredith Sigmon, CNM

## 2015-12-06 NOTE — Anesthesia Preprocedure Evaluation (Signed)
Anesthesia Evaluation  Patient identified by MRN, date of birth, ID band Patient awake    Reviewed: Allergy & Precautions, H&P , NPO status , Patient's Chart, lab work & pertinent test results  Airway Mallampati: II  TM Distance: >3 FB Neck ROM: full    Dental  (+) Chipped   Pulmonary former smoker,    Pulmonary exam normal        Cardiovascular negative cardio ROS Normal cardiovascular exam     Neuro/Psych    GI/Hepatic Neg liver ROS, GERD  ,  Endo/Other  negative endocrine ROS  Renal/GU negative Renal ROS     Musculoskeletal   Abdominal   Peds  Hematology negative hematology ROS (+)   Anesthesia Other Findings   Reproductive/Obstetrics (+) Pregnancy                             Anesthesia Physical Anesthesia Plan  ASA: II  Anesthesia Plan: Epidural   Post-op Pain Management:    Induction:   Airway Management Planned:   Additional Equipment:   Intra-op Plan:   Post-operative Plan:   Informed Consent: I have reviewed the patients History and Physical, chart, labs and discussed the procedure including the risks, benefits and alternatives for the proposed anesthesia with the patient or authorized representative who has indicated his/her understanding and acceptance.     Plan Discussed with: Anesthesiologist and CRNA  Anesthesia Plan Comments:         Anesthesia Quick Evaluation

## 2015-12-07 ENCOUNTER — Inpatient Hospital Stay: Payer: Medicaid Other | Admitting: Certified Registered"

## 2015-12-07 ENCOUNTER — Encounter
Admission: EM | Disposition: A | Discharge status: Home / Self Care | Payer: Self-pay | Source: Home / Self Care | Attending: Obstetrics and Gynecology

## 2015-12-07 ENCOUNTER — Encounter: Payer: Self-pay | Admitting: *Deleted

## 2015-12-07 HISTORY — PX: TUBAL LIGATION: SHX77

## 2015-12-07 LAB — CBC
HEMATOCRIT: 32.3 % — AB (ref 35.0–47.0)
Hemoglobin: 11.3 g/dL — ABNORMAL LOW (ref 12.0–16.0)
MCH: 30.8 pg (ref 26.0–34.0)
MCHC: 34.9 g/dL (ref 32.0–36.0)
MCV: 88.2 fL (ref 80.0–100.0)
PLATELETS: 160 10*3/uL (ref 150–440)
RBC: 3.66 MIL/uL — ABNORMAL LOW (ref 3.80–5.20)
RDW: 13.4 % (ref 11.5–14.5)
WBC: 11.4 10*3/uL — AB (ref 3.6–11.0)

## 2015-12-07 LAB — RPR: RPR Ser Ql: NONREACTIVE

## 2015-12-07 SURGERY — LIGATION, FALLOPIAN TUBE, POSTPARTUM
Anesthesia: General

## 2015-12-07 MED ORDER — FENTANYL CITRATE (PF) 100 MCG/2ML IJ SOLN
25.0000 ug | INTRAMUSCULAR | Status: DC | PRN
  Administered 2015-12-07 (×4): 25 ug via INTRAVENOUS

## 2015-12-07 MED ORDER — ONDANSETRON HCL 4 MG/2ML IJ SOLN: 4.0000 mg | Freq: Once | INTRAMUSCULAR | Status: DC | PRN

## 2015-12-07 MED ORDER — FENTANYL CITRATE (PF) 100 MCG/2ML IJ SOLN
INTRAMUSCULAR | Status: DC | PRN
  Administered 2015-12-07: 50 ug via INTRAVENOUS
  Administered 2015-12-07: 100 ug via INTRAVENOUS
  Administered 2015-12-07: 50 ug via INTRAVENOUS

## 2015-12-07 MED ORDER — SUCCINYLCHOLINE CHLORIDE 20 MG/ML IJ SOLN
INTRAMUSCULAR | Status: DC | PRN
  Administered 2015-12-07: 80 mg via INTRAVENOUS

## 2015-12-07 MED ORDER — ONDANSETRON HCL 4 MG/2ML IJ SOLN
INTRAMUSCULAR | Status: DC | PRN
  Administered 2015-12-07: 4 mg via INTRAVENOUS

## 2015-12-07 MED ORDER — PROPOFOL 10 MG/ML IV BOLUS
INTRAVENOUS | Status: DC | PRN
  Administered 2015-12-07: 130 mg via INTRAVENOUS

## 2015-12-07 MED ORDER — FAMOTIDINE 20 MG PO TABS
40.0000 mg | ORAL_TABLET | Freq: Once | ORAL | Status: AC
  Administered 2015-12-07: 40 mg via ORAL
  Filled 2015-12-07: qty 2

## 2015-12-07 MED ORDER — BUPIVACAINE HCL (PF) 0.5 % IJ SOLN
INTRAMUSCULAR | Status: AC
  Filled 2015-12-07: qty 30

## 2015-12-07 MED ORDER — BUPIVACAINE HCL 0.5 % IJ SOLN
INTRAMUSCULAR | Status: DC | PRN
  Administered 2015-12-07: 10 mL

## 2015-12-07 MED ORDER — KETOROLAC TROMETHAMINE 30 MG/ML IJ SOLN
INTRAMUSCULAR | Status: DC | PRN
  Administered 2015-12-07: 30 mg via INTRAVENOUS

## 2015-12-07 MED ORDER — DEXAMETHASONE SODIUM PHOSPHATE 10 MG/ML IJ SOLN
INTRAMUSCULAR | Status: DC | PRN
  Administered 2015-12-07: 4 mg via INTRAVENOUS

## 2015-12-07 MED ORDER — LACTATED RINGERS IV SOLN: INTRAVENOUS | Status: DC

## 2015-12-07 MED ORDER — METOCLOPRAMIDE HCL 10 MG PO TABS
10.0000 mg | ORAL_TABLET | Freq: Once | ORAL | Status: AC
  Administered 2015-12-07: 10 mg via ORAL
  Filled 2015-12-07: qty 1

## 2015-12-07 MED ORDER — FENTANYL CITRATE (PF) 100 MCG/2ML IJ SOLN
INTRAMUSCULAR | Status: AC
  Filled 2015-12-07: qty 2

## 2015-12-07 MED ORDER — LIDOCAINE HCL (CARDIAC) 20 MG/ML IV SOLN
INTRAVENOUS | Status: DC | PRN
  Administered 2015-12-07: 80 mg via INTRAVENOUS

## 2015-12-07 MED ORDER — ROCURONIUM BROMIDE 100 MG/10ML IV SOLN
INTRAVENOUS | Status: DC | PRN
  Administered 2015-12-07: 5 mg via INTRAVENOUS

## 2015-12-07 SURGICAL SUPPLY — 30 items
BLADE SURG SZ11 CARB STEEL (BLADE) ×2 IMPLANT
CANISTER SUCT 1200ML W/VALVE (MISCELLANEOUS) ×2 IMPLANT
CHLORAPREP W/TINT 26ML (MISCELLANEOUS) ×2 IMPLANT
DRAPE LAPAROTOMY 100X77 ABD (DRAPES) ×2 IMPLANT
DRSG OPSITE POSTOP 3X4 (GAUZE/BANDAGES/DRESSINGS) ×2 IMPLANT
ELECT REM PT RETURN 9FT ADLT (ELECTROSURGICAL) ×2
ELECTRODE REM PT RTRN 9FT ADLT (ELECTROSURGICAL) ×1 IMPLANT
GLOVE BIO SURGEON STRL SZ 6.5 (GLOVE) ×2 IMPLANT
GLOVE INDICATOR 7.0 STRL GRN (GLOVE) ×2 IMPLANT
GOWN STRL REUS W/ TWL LRG LVL3 (GOWN DISPOSABLE) ×1 IMPLANT
GOWN STRL REUS W/TWL LRG LVL3 (GOWN DISPOSABLE) ×1
KIT RM TURNOVER CYSTO AR (KITS) ×2 IMPLANT
LABEL OR SOLS (LABEL) ×2 IMPLANT
LIGASURE IMPACT 36 18CM CVD LR (INSTRUMENTS) IMPLANT
LIQUID BAND (GAUZE/BANDAGES/DRESSINGS) ×2 IMPLANT
NDL SAFETY 22GX1.5 (NEEDLE) ×2 IMPLANT
NS IRRIG 500ML POUR BTL (IV SOLUTION) ×2 IMPLANT
PACK BASIN MINOR ARMC (MISCELLANEOUS) ×2 IMPLANT
PAD OB MATERNITY 4.3X12.25 (PERSONAL CARE ITEMS) ×2 IMPLANT
RETRACTOR WOUND ALXS 18CM SML (MISCELLANEOUS) ×1 IMPLANT
RTRCTR WOUND ALEXIS O 18CM SML (MISCELLANEOUS) ×2
SPONGE LAP 4X18 5PK (MISCELLANEOUS) ×2 IMPLANT
SUT CHROMIC GUT BROWN 0 54 (SUTURE) ×1 IMPLANT
SUT CHROMIC GUT BROWN 0 54IN (SUTURE) ×2
SUT MNCRL 4-0 (SUTURE) ×1
SUT MNCRL 4-0 27XMFL (SUTURE) ×1
SUT VIC AB 0 CT2 27 (SUTURE) ×2 IMPLANT
SUT VICRYL 0 AB UR-6 (SUTURE) ×4 IMPLANT
SUTURE MNCRL 4-0 27XMF (SUTURE) ×1 IMPLANT
SYRINGE 10CC LL (SYRINGE) ×2 IMPLANT

## 2015-12-07 NOTE — Anesthesia Procedure Notes (Signed)
Procedure Name: Intubation Performed by: Casey Burkitt Pre-anesthesia Checklist: Patient identified, Patient being monitored, Timeout performed, Emergency Drugs available and Suction available Patient Re-evaluated:Patient Re-evaluated prior to inductionOxygen Delivery Method: Circle system utilized Preoxygenation: Pre-oxygenation with 100% oxygen Intubation Type: IV induction, Cricoid Pressure applied and Rapid sequence Laryngoscope Size: Mac and 3 Grade View: Grade I Tube type: Oral Tube size: 6.5 mm Number of attempts: 1 Airway Equipment and Method: Stylet Placement Confirmation: ETT inserted through vocal cords under direct vision,  positive ETCO2 and breath sounds checked- equal and bilateral Secured at: 20 cm Tube secured with: Tape Dental Injury: Teeth and Oropharynx as per pre-operative assessment

## 2015-12-07 NOTE — Anesthesia Preprocedure Evaluation (Signed)
Anesthesia Evaluation  Patient identified by MRN, date of birth, ID band Patient awake    Reviewed: Allergy & Precautions, NPO status , Patient's Chart, lab work & pertinent test results  History of Anesthesia Complications (+) PONV  Airway Mallampati: I       Dental   Pulmonary asthma , former smoker,           Cardiovascular negative cardio ROS       Neuro/Psych Anxiety Depression    GI/Hepatic negative GI ROS, Neg liver ROS,   Endo/Other  negative endocrine ROS  Renal/GU negative Renal ROS     Musculoskeletal   Abdominal   Peds  Hematology negative hematology ROS (+)   Anesthesia Other Findings   Reproductive/Obstetrics (+) Pregnancy                             Anesthesia Physical Anesthesia Plan  ASA: II  Anesthesia Plan: General   Post-op Pain Management:    Induction: Intravenous, Cricoid pressure planned and Rapid sequence  Airway Management Planned: Oral ETT  Additional Equipment:   Intra-op Plan:   Post-operative Plan:   Informed Consent: I have reviewed the patients History and Physical, chart, labs and discussed the procedure including the risks, benefits and alternatives for the proposed anesthesia with the patient or authorized representative who has indicated his/her understanding and acceptance.     Plan Discussed with:   Anesthesia Plan Comments:         Anesthesia Quick Evaluation

## 2015-12-07 NOTE — Anesthesia Postprocedure Evaluation (Signed)
Anesthesia Post Note  Patient: Erin Ellis  Procedure(s) Performed: * No procedures listed *  Patient location during evaluation: Mother Baby Anesthesia Type: Epidural Level of consciousness: awake and alert and oriented Pain management: satisfactory to patient Vital Signs Assessment: post-procedure vital signs reviewed and stable Respiratory status: respiratory function stable Cardiovascular status: stable Postop Assessment: no headache, epidural receding, adequate PO intake, no backache, no signs of nausea or vomiting and patient able to bend at knees Anesthetic complications: no    Last Vitals:  Vitals:   12/07/15 0338 12/07/15 0746  BP: 104/66 104/62  Pulse: 79 79  Resp: 20 18  Temp: 36.8 C 36.7 C    Last Pain:  Vitals:   12/07/15 0746  TempSrc: Oral  PainSc:                  Clydene PughBeane, Duante Arocho D

## 2015-12-07 NOTE — Progress Notes (Signed)
Post Partum Day 1 Subjective: "I am tired"   Objective: Blood pressure 104/62, pulse 79, temperature 98 F (36.7 C), temperature source Oral, resp. rate 18, height 5\' 3"  (1.6 m), weight 143 lb (64.9 kg), last menstrual period 03/19/2015, SpO2 99 %.  Physical Exam:  General:A,A&O x 3 Lungs: CTA bilat, no W/R/R. Heart: S1S2, RRR, No M/R/G. Lochia:mod, no clots Uterine Fundus:U_1, FF. DVT Evaluation:Neg Homans   Recent Labs  12/05/15 2110 12/07/15 0631  HGB 11.6* 11.3*  HCT 32.7* 32.3*  WBC 11.4* 11.4*  PLT 186 160    Assessment/Plan: A: PPD#1 stable P; Continue orders 2. BTL planned this am so NPO for now   LOS: 2 days   Sharee PimpleCaron W Jibran Crookshanks 12/07/2015, 8:18 AM

## 2015-12-07 NOTE — H&P (Addendum)
  Date of Initial H&P: 12/05/15  History reviewed, patient examined, no change in status, stable for surgery.  Paper chart H&P completed and will be scanned into EMR.  Confirms desire for permanent sterilization. R/B/A discussed.

## 2015-12-07 NOTE — Anesthesia Post-op Follow-up Note (Signed)
  Anesthesia Pain Follow-up Note  Patient: Erin Ellis  Day #: 1  Date of Follow-up: 12/07/2015 Time: 9:07 AM  Last Vitals:  Vitals:   12/07/15 0338 12/07/15 0746  BP: 104/66 104/62  Pulse: 79 79  Resp: 20 18  Temp: 36.8 C 36.7 C    Level of Consciousness: alert  Pain: mild   Side Effects:None  Catheter Site Exam: site not evaluated     Plan: D/C from anesthesia care  Clydene Pugh

## 2015-12-07 NOTE — Op Note (Signed)
Erin Ellis 12/05/2015 - 12/07/2015  PREOPERATIVE DIAGNOSES: Multiparity, undesired fertility  POSTOPERATIVE DIAGNOSES: Multiparity, undesired fertility  PROCEDURE:  Postpartum Bilateral Tubal Ligation  SURGEON: Dr. Christeen Douglas  ANESTHESIA:  GETA and local analgesia using 20 ml of 0.5% Marcaine  Anesthesiologist: Gijsbertus F Darleene Cleaver, MD Anesthesiologist: Lezlie Octave, MD; Naomie Dean, MD CRNA: Casey Burkitt, CRNA  COMPLICATIONS:  None immediate.  ESTIMATED BLOOD LOSS: minimal.  FLUIDS: 500 ml LR.  URINE OUTPUT:  Not assessed  INDICATIONS:  25 y.o. [id number] with undesired fertility,status post vaginal delivery, desires permanent sterilization.  Other reversible forms of contraception were discussed with patient; she declines all other modalities. Risks of procedure discussed with patient including but not limited to: risk of regret, permanence of method, bleeding, infection, injury to surrounding organs and need for additional procedures.  Failure risk of 1 -2 % with increased risk of ectopic gestation if pregnancy occurs was also discussed with patient.      FINDINGS:  Normal uterus, tubes, and ovaries.  PROCEDURE DETAILS:  The patient was taken to the operating room where her epidural anesthesia was dosed up to surgical level and found to be adequate. She was then placed in the dorsal supine position and prepped and draped in sterile fashion. After an adequate timeout was performed, attention was turned to the patient's abdomen where a small transverse skin incision was made under the umbilical fold. The incision was taken down to the layer of fascia using the scalpel, and fascia was incised, and extended bilaterally using Mayo scissors. The peritoneum was entered in a sharp fashion. Attention was then turned to the patient's uterus, and left fallopian tube was identified and followed out to the fimbriated end. Using a Ligasure bipolar cautery device, the  left tube was cauterized and sharply excised. A similar process was carried out on the right side allowing for bilateral tubal sterilization. Both fimbriae were removed. Good hemostasis was noted overall. Local analgesia was poured over both adenexa.The instruments were then removed from the patient's abdomen and the fascial incision was repaired with 0 Vicryl, and the skin was closed with a 4-0 Vicryl subcuticular stitch. The patient tolerated the procedure well. Instrument, sponge, and needle counts were correct times two. The patient was then taken to the recovery room awake and in stable condition.

## 2015-12-07 NOTE — Anesthesia Postprocedure Evaluation (Signed)
Anesthesia Post Note  Patient: Erin Ellis  Procedure(s) Performed: Procedure(s) (LRB): POST PARTUM TUBAL LIGATION (N/A)  Patient location during evaluation: PACU Anesthesia Type: General Level of consciousness: awake Pain management: satisfactory to patient Vital Signs Assessment: post-procedure vital signs reviewed and stable Cardiovascular status: stable Anesthetic complications: no    Last Vitals:  Vitals:   12/07/15 1359 12/07/15 1403  BP:  (!) 134/97  Pulse:  84  Resp: 17 20  Temp:      Last Pain:  Vitals:   12/07/15 1425  TempSrc:   PainSc: 3                  VAN STAVEREN,Aeron Donaghey

## 2015-12-07 NOTE — Transfer of Care (Signed)
Immediate Anesthesia Transfer of Care Note  Patient: Erin Ellis  Procedure(s) Performed: Procedure(s): POST PARTUM TUBAL LIGATION (N/A)  Patient Location: PACU  Anesthesia Type:General  Level of Consciousness: sedated and responds to stimulation  Airway & Oxygen Therapy: Patient Spontanous Breathing and Patient connected to face mask oxygen  Post-op Assessment: Report given to RN and Post -op Vital signs reviewed and stable  Post vital signs: Reviewed and stable  Last Vitals:  Vitals:   12/07/15 1150 12/07/15 1403  BP: 117/77 (!) 134/97  Pulse: 80 84  Resp: 18 20  Temp: 37.3 C     Last Pain:  Vitals:   12/07/15 1150  TempSrc: Tympanic  PainSc: 2          Complications: No apparent anesthesia complications

## 2015-12-08 LAB — SURGICAL PATHOLOGY

## 2015-12-08 LAB — CBC
HEMATOCRIT: 32.4 % — AB (ref 35.0–47.0)
HEMOGLOBIN: 11.1 g/dL — AB (ref 12.0–16.0)
MCH: 30.8 pg (ref 26.0–34.0)
MCHC: 34.2 g/dL (ref 32.0–36.0)
MCV: 90 fL (ref 80.0–100.0)
Platelets: 206 10*3/uL (ref 150–440)
RBC: 3.59 MIL/uL — ABNORMAL LOW (ref 3.80–5.20)
RDW: 13.5 % (ref 11.5–14.5)
WBC: 11.7 10*3/uL — ABNORMAL HIGH (ref 3.6–11.0)

## 2015-12-08 MED ORDER — IBUPROFEN 600 MG PO TABS: 600.0000 mg | ORAL_TABLET | Freq: Four times a day (QID) | ORAL | 0 refills | Status: DC

## 2015-12-08 MED ORDER — OXYCODONE-ACETAMINOPHEN 5-325 MG PO TABS: 1.0000 | ORAL_TABLET | ORAL | 0 refills | Status: DC | PRN

## 2015-12-08 NOTE — Discharge Summary (Signed)
Obstetric Discharge Summary   Patient ID: Erin Ellis MRN: [id number] DOB/AGE: [birth year] 26 y.o.   Date of Admission: 12/05/2015  Date of Discharge: 12/08/15  Admitting Diagnosis: Induction of labor at [redacted]w[redacted]d for cholestasis of pregnancy   Secondary Diagnosis: : Cholestasis of pregnancy on Ursodiol, tobacco use, but quit 09/14/15, migraines and using Fioricet PRN, anxiety and depression on Wellbutrin 150mg  XL, gallstones without cholecystitis   Mode of Delivery: normal spontaneous vaginal delivery on 12/06/15      Discharge Diagnosis: Postpartum care following vaginal delivery, s/p Postpartum Tubal Ligation    Intrapartum Procedures: Atificial rupture of membranes, epidural and pitocin augmentation   Post partum procedures: postpartum tubal ligation  Complications: none   Brief Hospital Course  Erin Ellis is a [id number] who had a SVD on 12/06/15;  for further details of this delivery, please refer to the delivery note.  Patient had an uncomplicated postpartum course.  She had a postpartum tubal ligation on 12/07/15 without complication.  By time of discharge on PPD#2, her pain was controlled on oral pain medications; she had appropriate lochia and was ambulating, voiding without difficulty and tolerating regular diet.  She was deemed stable for discharge to home.     Labs: CBC Latest Ref Rng & Units 12/08/2015 12/07/2015 12/05/2015  WBC 3.6 - 11.0 K/uL 11.7(H) 11.4(H) 11.4(H)  Hemoglobin 12.0 - 16.0 g/dL 11.1(L) 11.3(L) 11.6(L)  Hematocrit 35.0 - 47.0 % 32.4(L) 32.3(L) 32.7(L)  Platelets 150 - 440 K/uL 206 160 186   A POS  Physical exam:  Blood pressure 110/65, pulse 68, temperature 98 F (36.7 Ellis), temperature source Oral, resp. rate 18, height 5\' 3"  (1.6 m), weight 64.9 kg (143 lb), last menstrual period 03/19/2015, SpO2 100 %, unknown if currently breastfeeding. General: alert and no distress Lochia: appropriate Abdomen: soft, NT Uterine Fundus: firm Incision: healing  well, no significant drainage, no dehiscence, no significant erythema Extremities: No evidence of DVT seen on physical exam. No lower extremity edema.  Discharge Instructions: Per After Visit Summary. Activity: Advance as tolerated. Pelvic rest for 6 weeks.  Also refer to After Visit Summary Diet: Regular Medications:   Medication List    STOP taking these medications   Butalbital-APAP-Caffeine 50-325-40 MG capsule   cyclobenzaprine 10 MG tablet Commonly known as:  FLEXERIL   HYDROcodone-acetaminophen 5-325 MG tablet Commonly known as:  NORCO/VICODIN   metroNIDAZOLE 500 MG tablet Commonly known as:  FLAGYL   nitrofurantoin (macrocrystal-monohydrate) 100 MG capsule Commonly known as:  MACROBID   ondansetron 4 MG disintegrating tablet Commonly known as:  ZOFRAN ODT   potassium chloride 10 MEQ tablet Commonly known as:  K-DUR,KLOR-CON   promethazine 25 MG tablet Commonly known as:  PHENERGAN   ranitidine 150 MG tablet Commonly known as:  ZANTAC   ursodiol 300 MG capsule Commonly known as:  ACTIGALL     TAKE these medications   buPROPion 150 MG 24 hr tablet Commonly known as:  WELLBUTRIN XL Take by mouth. What changed:  Another medication with the same name was removed. Continue taking this medication, and follow the directions you see here.   ibuprofen 600 MG tablet Commonly known as:  ADVIL,MOTRIN Take 1 tablet (600 mg total) by mouth every 6 (six) hours.   IRON PO Take by mouth.   oxyCODONE-acetaminophen 5-325 MG tablet Commonly known as:  PERCOCET/ROXICET Take 1-2 tablets by mouth every 4 (four) hours as needed (pain scale 4-7).   prenatal multivitamin Tabs tablet Take 1 tablet by mouth daily at  12 noon.      Outpatient follow up:  Follow-up Information    Christeen DouglasBEASLEY, BETHANY, MD. Schedule an appointment as soon as possible for a visit in 2 week(s).   Specialty:  Obstetrics and Gynecology Why:  Post-op appointment  Contact information: [address] [place] [id number] [phone]        Erin AddisonSigmon, Erin Ellis, Erin Ellis Follow up in 6 week(s).   Specialty:  Certified Nurse Midwife Why:  Postpartum visit - needs repeat CMP/Bile Acids  Contact information: [address] [place] [id number] [phone]          Postpartum contraception: bilateral tubal ligation  Discharged Condition: good  Discharged to: home  Newborn Data:  Baby Girl named "Belle"  Disposition:home with mother  Apgars: APGAR (1 MIN): 8   APGAR (5 MINS): 9    Baby Feeding: Breast  Erin AddisonSigmon, Erin Ellis, Erin Ellis 12/08/2015

## 2015-12-08 NOTE — Progress Notes (Signed)
Pt discharged home with infant.  Discharge instructions and follow up appointment given to and reviewed with pt.  Pt verbalized understanding.  Escorted by auxillary. 

## 2015-12-08 NOTE — Discharge Instructions (Signed)
Care After Vaginal Delivery °Congratulations on your new baby!! ° °Refer to this sheet in the next few weeks. These discharge instructions provide you with information on caring for yourself after delivery. Your caregiver may also give you specific instructions. Your treatment has been planned according to the most current medical practices available, but problems sometimes occur. Call your caregiver if you have any problems or questions after you go home. ° °HOME CARE INSTRUCTIONS °· Take over-the-counter or prescription medicines only as directed by your caregiver or pharmacist. °· Do not drink alcohol, especially if you are breastfeeding or taking medicine to relieve pain. °· Do not chew or smoke tobacco. °· Do not use illegal drugs. °· Continue to use good perineal care. Good perineal care includes: °¨ Wiping your perineum from front to back. °¨ Keeping your perineum clean. °· Do not use tampons or douche until your caregiver says it is okay. °· Shower, wash your hair, and take tub baths as directed by your caregiver. °· Wear a well-fitting bra that provides breast support. °· Eat healthy foods. °· Drink enough fluids to keep your urine clear or pale yellow. °· Eat high-fiber foods such as whole grain cereals and breads, brown rice, beans, and fresh fruits and vegetables every day. These foods may help prevent or relieve constipation. °· Follow your caregiver's recommendations regarding resumption of activities such as climbing stairs, driving, lifting, exercising, or traveling. Specifically, no driving for two weeks, so that you are comfortable reacting quickly in an emergency. °· Talk to your caregiver about resuming sexual activities. Resumption of sexual activities is dependent upon your risk of infection, your rate of healing, and your comfort and desire to resume sexual activity. Usually we recommend waiting about six weeks, or until your bleeding stops and you are interested in sex. °· Try to have someone  help you with your household activities and your newborn for at least a few days after you leave the hospital. Even longer is better. °· Rest as much as possible. Try to rest or take a nap when your newborn is sleeping. Sleep deprivation can be very hard after delivery. °· Increase your activities gradually. °· Keep all of your scheduled postpartum appointments. It is very important to keep your scheduled follow-up appointments. At these appointments, your caregiver will be checking to make sure that you are healing physically and emotionally. ° °SEEK MEDICAL CARE IF:  °· You are passing large clots from your vagina.  °· You have a foul smelling discharge from your vagina. °· You have trouble urinating. °· You are urinating frequently. °· You have pain when you urinate. °· You have a change in your bowel movements. °· You have increasing redness, pain, or swelling near your vaginal incision (episiotomy) or vaginal tear. °· You have pus draining from your episiotomy or vaginal tear. °· Your episiotomy or vaginal tear is separating. °· You have painful, hard, or reddened breasts. °· You have a severe headache. °· You have blurred vision or see spots. °· You feel sad or depressed. °· You have thoughts of hurting yourself or your newborn. °· You have questions about your care, the care of your newborn, or medicines. °· You are dizzy or light-headed. °· You have a rash. °· You have nausea or vomiting. °· You were breastfeeding and have not had a menstrual period within 12 weeks after you stopped breastfeeding. °· You are not breastfeeding and have not had a menstrual period by the 12th week after delivery. °· You   have a fever. ° °SEEK IMMEDIATE MEDICAL CARE IF:  °· You have persistent pain. °· You have chest pain. °· You have shortness of breath. °· You faint. °· You have leg pain. °· You have stomach pain. °· Your vaginal bleeding saturates two or more sanitary pads in 1 hour. ° °MAKE SURE YOU:  °· Understand these  instructions. °· Will get help right away if you are not doing well or get worse. °·  °Document Released: 03/08/2000 Document Revised: 07/26/2013 Document Reviewed: 11/06/2011 ° °Exit[employer]® Patient Information ©2015 [employer]. This information is not intended to replace advice given to you by your health care provider. Make sure you discuss any questions you have with your health care provider. ° °

## 2015-12-11 ENCOUNTER — Encounter: Payer: Self-pay | Admitting: Obstetrics and Gynecology

## 2015-12-19 ENCOUNTER — Encounter: Payer: Self-pay | Admitting: Obstetrics and Gynecology

## 2015-12-19 NOTE — Progress Notes (Signed)
Clarification of surgical procedure from 12/07/15  Procedure: Postpartum bilateral tubal ligation by bilateral partial salpingectomy, including fimbriated ends  Specimen: Portion of right and left Fallopian tubes

## 2015-12-27 MED ORDER — DINOPROSTONE 10 MG VA INST
10.0000 mg | VAGINAL_INSERT | Freq: Once | VAGINAL | Status: AC
  Filled 2015-12-27: qty 1

## 2016-06-02 ENCOUNTER — Encounter: Payer: Self-pay | Admitting: Emergency Medicine

## 2016-06-02 ENCOUNTER — Emergency Department
Admission: EM | Admit: 2016-06-02 | Discharge: 2016-06-02 | Disposition: A | Discharge status: Home / Self Care | Payer: Medicaid Other | Attending: Emergency Medicine | Admitting: Emergency Medicine

## 2016-06-02 DIAGNOSIS — J45909 Unspecified asthma, uncomplicated: Secondary | ICD-10-CM | POA: Insufficient documentation

## 2016-06-02 DIAGNOSIS — K0889 Other specified disorders of teeth and supporting structures: Secondary | ICD-10-CM | POA: Diagnosis present

## 2016-06-02 DIAGNOSIS — F909 Attention-deficit hyperactivity disorder, unspecified type: Secondary | ICD-10-CM | POA: Insufficient documentation

## 2016-06-02 DIAGNOSIS — Z87891 Personal history of nicotine dependence: Secondary | ICD-10-CM | POA: Diagnosis not present

## 2016-06-02 MED ORDER — AMOXICILLIN 500 MG PO TABS: 500.0000 mg | ORAL_TABLET | Freq: Three times a day (TID) | ORAL | 0 refills | Status: DC

## 2016-06-02 MED ORDER — AMOXICILLIN 500 MG PO CAPS
500.0000 mg | ORAL_CAPSULE | Freq: Once | ORAL | Status: AC
  Administered 2016-06-02: 500 mg via ORAL
  Filled 2016-06-02: qty 1

## 2016-06-02 MED ORDER — NAPROXEN 500 MG PO TABS: 500.0000 mg | ORAL_TABLET | Freq: Two times a day (BID) | ORAL | 0 refills | Status: DC

## 2016-06-02 MED ORDER — NAPROXEN 500 MG PO TABS
500.0000 mg | ORAL_TABLET | Freq: Once | ORAL | Status: AC
  Administered 2016-06-02: 500 mg via ORAL
  Filled 2016-06-02: qty 1

## 2016-06-02 NOTE — ED Triage Notes (Signed)
Pt c/o pain to left lower side of her mouth since she had her wisdom teeth pulled a month ago; pt says she has followed up [facility] where the procedure was done; says they told her there was a "knot" there and it should resolve; pain has not resolved;

## 2016-06-02 NOTE — ED Provider Notes (Signed)
[facility] Emergency Department Provider Note ____________________________________________  Time seen: Approximately 9:48 PM  I have reviewed the triage vital signs and the nursing notes.   HISTORY  Chief Complaint Dental Pain   HPI Erin Ellis is a 26 y.o. female who presents to the emergency department for evaluation of dental pain. She had her bottom wisdom teeth removed about a month ago and has felt a "knot" in the left lower jaw since that time. She states she has followed up with the oral surgeon who advised her it was "normal" and did not give her any antibiotics or other medications for the pain. She denies facial swelling or fever.  Past Medical History:  Diagnosis Date  . Abortion    In 2015 (8 weeks, 4 days pregnant)  . Anxiety   . Complication of anesthesia   . Depression    anxiety, depression- on Wellbutrtin   . Headache    sees a neurologist on Aug 3, occurrs everday whole head, even without preg  . Low blood potassium 07/2015   treated with Potassium in ER  . Medical history non-contributory   . Miscarriage   . PONV (postoperative nausea and vomiting)     Patient Active Problem List   Diagnosis Date Noted  . Cholestasis of pregnancy in third trimester 12/05/2015  . Intrahepatic cholestasis of pregnancy, antepartum 10/31/2015  . Chronic tension-type headache, not intractable 10/26/2015  . Pregnancy related back pain in third trimester, antepartum 10/26/2015  . Labor and delivery affected by abnormality of maternal pelvic organs 09/23/2015  . Threatened preterm labor 09/18/2015  . Abdominal pain affecting pregnancy, antepartum 09/08/2015  . Depression affecting pregnancy in third trimester, antepartum 09/01/2015  . H/O migraine 07/31/2015  . Tobacco use affecting pregnancy in second trimester, antepartum 07/04/2015  . Supervision of other high risk pregnancies, third trimester 06/05/2015  . Anxiety and depression 11/01/2014   . Asthma 11/01/2014  . Supervision of normal pregnancy in first trimester 11/01/2014  . Tobacco abuse 11/01/2014  . Anxiety 04/19/2013  . History of ADHD 04/19/2013    Past Surgical History:  Procedure Laterality Date  . ABDOMINAL EXPLORATION SURGERY    . TUBAL LIGATION N/A 12/07/2015   Procedure: POST PARTUM TUBAL LIGATION;  Surgeon: Christeen Douglas, MD;  Location: [facility] ORS;  Service: Gynecology;  Laterality: N/A;    Prior to Admission medications   Medication Sig Start Date End Date Taking? Authorizing Provider  ibuprofen (ADVIL,MOTRIN) 600 MG tablet Take 1 tablet (600 mg total) by mouth every 6 (six) hours. 12/08/15  Yes Karena Addison, CNM  Prenatal Vit-Fe Fumarate-FA (PRENATAL MULTIVITAMIN) TABS tablet Take 1 tablet by mouth daily at 12 noon.   Yes Historical Provider, MD  amoxicillin (AMOXIL) 500 MG tablet Take 1 tablet (500 mg total) by mouth 3 (three) times daily. 06/02/16   Chinita Pester, FNP  naproxen (NAPROSYN) 500 MG tablet Take 1 tablet (500 mg total) by mouth 2 (two) times daily with a meal. 06/02/16   Chinita Pester, FNP    Allergies Sulfa antibiotics  Family History  Problem Relation Age of Onset  . Hypertension Mother   . Mental illness Mother   . Alcohol abuse Father   . Mental illness Father     Social History Social History  Substance Use Topics  . Smoking status: Former Smoker    Packs/day: 1.00    Types: Cigarettes    Quit date: 09/11/2015  . Smokeless tobacco: Never Used  . Alcohol use  No    Review of Systems Constitutional: Well appearing. Negative for fever. ENT: Positive for dental pain. Musculoskeletal: Positive for jaw pain. Negative for trismus. Skin: Negative for facial swelling ____________________________________________   PHYSICAL EXAM:  VITAL SIGNS: ED Triage Vitals  Enc Vitals Group     BP 06/02/16 2055 124/70     Pulse Rate 06/02/16 2055 75     Resp 06/02/16 2055 18     Temp 06/02/16 2055 98.3 F (36.8 C)     Temp  Source 06/02/16 2055 Oral     SpO2 06/02/16 2055 98 %     Weight 06/02/16 2057 135 lb (61.2 kg)     Height 06/02/16 2057 5\' 3"  (1.6 m)     Head Circumference --      Peak Flow --      Pain Score 06/02/16 2058 8     Pain Loc --      Pain Edu? --      Excl. in GC? --     Constitutional: Alert and oriented. Well appearing and in no acute distress. Eyes: Conjunctivae are normal.EOMI. Mouth/Throat: Mucous membranes are moist. Oropharynx non-erythematous. Periodontal Exam: Unremarkable-no area of fluctuance noted over the left lower jaw, no induration of the buccal surfaces, no induration over the sublingual surface. Gingival surface appears to be well-healed without open socket from extraction of that was induced. Hematological/Lymphatic/Immunilogical: No cervical lymphadenopathy. Respiratory: Normal respiratory effort.  Musculoskeletal: Full ROM x 4 extremities. Neurologic:  Normal speech and language. No gross focal neurologic deficits are appreciated. Speech is normal. Skin:  Warm, dry, no erythema or edema. Psychiatric: Mood and affect are normal. Speech and behavior are normal.  ____________________________________________   LABS (all labs ordered are listed, but only abnormal results are displayed)  Labs Reviewed - No data to display ____________________________________________   RADIOLOGY  Not indicated ____________________________________________   PROCEDURES  Procedure(s) performed: None  Critical Care performed: No ____________________________________________   INITIAL IMPRESSION / ASSESSMENT AND PLAN / ED COURSE  Patient to be placed on amoxicillin and given Naprosyn for pain. She is instructed to follow up with her dentist if the antibiotic does not resolve the pain. She was instructed to return to the emergency department for symptoms that change or worsen if she is unable schedule an appointment.  Pertinent labs & imaging results that were available during  my care of the patient were reviewed by me and considered in my medical decision making (see chart for details).  ____________________________________________   FINAL CLINICAL IMPRESSION(S) / ED DIAGNOSES  Final diagnoses:  Pain, dental    Discharge Medication List as of 06/02/2016  9:58 PM    START taking these medications   Details  amoxicillin (AMOXIL) 500 MG tablet Take 1 tablet (500 mg total) by mouth 3 (three) times daily., Starting Sun 06/02/2016, Print    naproxen (NAPROSYN) 500 MG tablet Take 1 tablet (500 mg total) by mouth 2 (two) times daily with a meal., Starting Sun 06/02/2016, Print        If controlled substance prescribed during this visit, 12 month history viewed on the [facility] prior to issuing an initial prescription for Schedule II or III opiod.  Note:  This document was prepared using Dragon voice recognition software and may include unintentional dictation errors.      Chinita Pester, FNP 06/02/16 2257    Jennye Moccasin, MD 06/02/16 2328

## 2017-03-12 ENCOUNTER — Emergency Department
Admission: EM | Admit: 2017-03-12 | Discharge: 2017-03-12 | Disposition: A | Discharge status: Home / Self Care | Payer: Medicaid Other | Attending: Emergency Medicine | Admitting: Emergency Medicine

## 2017-03-12 ENCOUNTER — Other Ambulatory Visit: Payer: Self-pay

## 2017-03-12 DIAGNOSIS — Z791 Long term (current) use of non-steroidal anti-inflammatories (NSAID): Secondary | ICD-10-CM | POA: Insufficient documentation

## 2017-03-12 DIAGNOSIS — N3001 Acute cystitis with hematuria: Secondary | ICD-10-CM

## 2017-03-12 DIAGNOSIS — Z87891 Personal history of nicotine dependence: Secondary | ICD-10-CM | POA: Insufficient documentation

## 2017-03-12 DIAGNOSIS — J45909 Unspecified asthma, uncomplicated: Secondary | ICD-10-CM | POA: Diagnosis not present

## 2017-03-12 DIAGNOSIS — R300 Dysuria: Secondary | ICD-10-CM | POA: Diagnosis present

## 2017-03-12 LAB — URINALYSIS, COMPLETE (UACMP) WITH MICROSCOPIC
BILIRUBIN URINE: NEGATIVE
Glucose, UA: NEGATIVE mg/dL
Ketones, ur: NEGATIVE mg/dL
NITRITE: POSITIVE — AB
PH: 6 (ref 5.0–8.0)
Protein, ur: NEGATIVE mg/dL
SPECIFIC GRAVITY, URINE: 1.008 (ref 1.005–1.030)

## 2017-03-12 MED ORDER — NITROFURANTOIN MONOHYD MACRO 100 MG PO CAPS: 100.0000 mg | ORAL_CAPSULE | Freq: Two times a day (BID) | ORAL | 0 refills | Status: AC

## 2017-03-12 MED ORDER — NITROFURANTOIN MONOHYD MACRO 100 MG PO CAPS
100.0000 mg | ORAL_CAPSULE | Freq: Once | ORAL | Status: AC
  Administered 2017-03-12: 100 mg via ORAL
  Filled 2017-03-12: qty 1

## 2017-03-12 NOTE — ED Triage Notes (Signed)
Pt states she has urinary frequency and burning on urination. States has noted blood in the urine with small clots, pt has been taking azo without relief. States symptoms started for a week, pt also having lower abd pain.

## 2017-03-12 NOTE — Discharge Instructions (Signed)
Please make sure you are drinking lots of fluids.  Take antibiotics as prescribed.  Return to the emergency department for any fevers, back pain, nausea vomiting, worsening symptoms or urgent changes in your health.

## 2017-03-12 NOTE — ED Provider Notes (Signed)
[facility] EMERGENCY DEPARTMENT Provider Note   CSN: [id number] Arrival date & time: 03/12/17  2028     History   Chief Complaint Chief Complaint  Patient presents with  . Dysuria    HPI Erin Ellis is a 26 y.o. female presents today for evaluation of dysuria.  Symptoms present times 7 days.  She describes burning sensation with urination, increase in urinary frequency, as well as pelvic pressure.  She has been taking Azo with mild improvement.  She has been drinking lots of fluids.  She denies any fevers, back pain, nausea, vomiting or vaginal discharge.  HPI  Past Medical History:  Diagnosis Date  . Abortion    In 2015 (8 weeks, 4 days pregnant)  . Anxiety   . Complication of anesthesia   . Depression    anxiety, depression- on Wellbutrtin   . Headache    sees a neurologist on Aug 3, occurrs everday whole head, even without preg  . Low blood potassium 07/2015   treated with Potassium in ER  . Medical history non-contributory   . Miscarriage   . PONV (postoperative nausea and vomiting)     Patient Active Problem List   Diagnosis Date Noted  . Cholestasis of pregnancy in third trimester 12/05/2015  . Intrahepatic cholestasis of pregnancy, antepartum 10/31/2015  . Chronic tension-type headache, not intractable 10/26/2015  . Pregnancy related back pain in third trimester, antepartum 10/26/2015  . Labor and delivery affected by abnormality of maternal pelvic organs 09/23/2015  . Threatened preterm labor 09/18/2015  . Abdominal pain affecting pregnancy, antepartum 09/08/2015  . Depression affecting pregnancy in third trimester, antepartum 09/01/2015  . H/O migraine 07/31/2015  . Tobacco use affecting pregnancy in second trimester, antepartum 07/04/2015  . Supervision of other high risk pregnancies, third trimester 06/05/2015  . Anxiety and depression 11/01/2014  . Asthma 11/01/2014  . Supervision of normal pregnancy in first trimester  11/01/2014  . Tobacco abuse 11/01/2014  . Anxiety 04/19/2013  . History of ADHD 04/19/2013    Past Surgical History:  Procedure Laterality Date  . ABDOMINAL EXPLORATION SURGERY    . TUBAL LIGATION N/A 12/07/2015   Procedure: POST PARTUM TUBAL LIGATION;  Surgeon: Christeen Douglas, MD;  Location: [facility] ORS;  Service: Gynecology;  Laterality: N/A;    OB History    Gravida Para Term Preterm AB Living   9 2 2  0 6 2   SAB TAB Ectopic Multiple Live Births   5 1 0 0 2       Home Medications    Prior to Admission medications   Medication Sig Start Date End Date Taking? Authorizing Provider  amoxicillin (AMOXIL) 500 MG tablet Take 1 tablet (500 mg total) by mouth 3 (three) times daily. 06/02/16   Triplett, Rulon Eisenmenger B, FNP  ibuprofen (ADVIL,MOTRIN) 600 MG tablet Take 1 tablet (600 mg total) by mouth every 6 (six) hours. 12/08/15   Sigmon, Scarlette Slice, CNM  naproxen (NAPROSYN) 500 MG tablet Take 1 tablet (500 mg total) by mouth 2 (two) times daily with a meal. 06/02/16   Triplett, Cari B, FNP  nitrofurantoin, macrocrystal-monohydrate, (MACROBID) 100 MG capsule Take 1 capsule (100 mg total) by mouth 2 (two) times daily for 5 days. 03/12/17 03/17/17  Evon Slack, PA-C  Prenatal Vit-Fe Fumarate-FA (PRENATAL MULTIVITAMIN) TABS tablet Take 1 tablet by mouth daily at 12 noon.    [provider]    Family History Family History  Problem Relation Age of Onset  .  Hypertension Mother   . Mental illness Mother   . Alcohol abuse Father   . Mental illness Father     Social History Social History   Tobacco Use  . Smoking status: Former Smoker    Packs/day: 1.00    Types: Cigarettes    Last attempt to quit: 09/11/2015    Years since quitting: 1.5  . Smokeless tobacco: Never Used  Substance Use Topics  . Alcohol use: No  . Drug use: No     Allergies   Sulfa antibiotics   Review of Systems Review of Systems  Constitutional: Negative for fever.  Respiratory: Negative for  shortness of breath.   Cardiovascular: Negative for chest pain.  Gastrointestinal: Negative for abdominal pain.  Genitourinary: Positive for frequency and urgency. Negative for difficulty urinating, vaginal bleeding and vaginal discharge.  Musculoskeletal: Negative for back pain and myalgias.  Skin: Negative for rash.  Neurological: Negative for dizziness and headaches.     Physical Exam Updated Vital Signs BP 116/85 (BP Location: Left Arm)   Pulse 85   Temp 98 F (36.7 C) (Oral)   Resp 18   Ht 5\' 3"  (1.6 m)   Wt 59.9 kg (132 lb)   LMP 03/05/2017   SpO2 100%   BMI 23.38 kg/m   Physical Exam  Constitutional: She is oriented to person, place, and time. She appears well-developed and well-nourished. No distress.  HENT:  Head: Normocephalic and atraumatic.  Mouth/Throat: Oropharynx is clear and moist.  Eyes: Conjunctivae are normal. Right eye exhibits no discharge. Left eye exhibits no discharge.  Neck: Normal range of motion.  Cardiovascular: Normal rate.  Pulmonary/Chest: No respiratory distress.  Abdominal: Soft. She exhibits no distension. There is no tenderness. There is no guarding.  No CVA tenderness.  Musculoskeletal: Normal range of motion. She exhibits no deformity.  Neurological: She is alert and oriented to person, place, and time. She has normal reflexes.  Skin: Skin is warm and dry.  Psychiatric: She has a normal mood and affect. Her behavior is normal. Thought content normal.     ED Treatments / Results  Labs (all labs ordered are listed, but only abnormal results are displayed) Labs Reviewed  URINALYSIS, COMPLETE (UACMP) WITH MICROSCOPIC - Abnormal; Notable for the following components:      Result Value   Color, Urine AMBER (*)    APPearance CLEAR (*)    Hgb urine dipstick SMALL (*)    Nitrite POSITIVE (*)    Leukocytes, UA SMALL (*)    Bacteria, UA FEW (*)    Squamous Epithelial / LPF 0-5 (*)    All other components within normal limits  POC URINE  PREG, ED    EKG  EKG Interpretation None       Radiology No results found.  Procedures Procedures (including critical care time)  Medications Ordered in ED Medications  nitrofurantoin (macrocrystal-monohydrate) (MACROBID) capsule 100 mg (not administered)     Initial Impression / Assessment and Plan / ED Course  I have reviewed the triage vital signs and the nursing notes.  Pertinent labs & imaging results that were available during my care of the patient were reviewed by me and considered in my medical decision making (see chart for details).     26 year old female with urinary tract infection.  She is started on Macrobid 100 mg twice daily for 5 days.  She will drink lots of fluids.  She is educated on signs and symptoms to return to the ED for.  Patient is afebrile and without lower back pain.  Tolerating p.o. well.  Final Clinical Impressions(s) / ED Diagnoses   Final diagnoses:  Acute cystitis with hematuria    ED Discharge Orders        Ordered    nitrofurantoin, macrocrystal-monohydrate, (MACROBID) 100 MG capsule  2 times daily     03/12/17 2111       Ronnette JuniperGaines, Thomas C, PA-C 03/12/17 2114    Loleta RoseForbach, Cory, MD 03/12/17 2145

## 2017-07-06 ENCOUNTER — Other Ambulatory Visit: Payer: Self-pay

## 2017-07-06 ENCOUNTER — Emergency Department
Admission: EM | Admit: 2017-07-06 | Discharge: 2017-07-06 | Disposition: A | Discharge status: Home / Self Care | Payer: Medicaid Other | Attending: Emergency Medicine | Admitting: Emergency Medicine

## 2017-07-06 ENCOUNTER — Encounter: Payer: Self-pay | Admitting: Emergency Medicine

## 2017-07-06 DIAGNOSIS — Z79899 Other long term (current) drug therapy: Secondary | ICD-10-CM | POA: Insufficient documentation

## 2017-07-06 DIAGNOSIS — R300 Dysuria: Secondary | ICD-10-CM | POA: Diagnosis present

## 2017-07-06 DIAGNOSIS — N3000 Acute cystitis without hematuria: Secondary | ICD-10-CM | POA: Insufficient documentation

## 2017-07-06 DIAGNOSIS — J45909 Unspecified asthma, uncomplicated: Secondary | ICD-10-CM | POA: Insufficient documentation

## 2017-07-06 DIAGNOSIS — Z87891 Personal history of nicotine dependence: Secondary | ICD-10-CM | POA: Insufficient documentation

## 2017-07-06 LAB — URINALYSIS, COMPLETE (UACMP) WITH MICROSCOPIC: Specific Gravity, Urine: 1.016 (ref 1.005–1.030)

## 2017-07-06 LAB — POCT PREGNANCY, URINE: PREG TEST UR: NEGATIVE

## 2017-07-06 MED ORDER — NITROFURANTOIN MONOHYD MACRO 100 MG PO CAPS: 100.0000 mg | ORAL_CAPSULE | Freq: Two times a day (BID) | ORAL | 0 refills | Status: AC

## 2017-07-06 NOTE — ED Triage Notes (Signed)
C/o dysuria and increased urinary frequency since Thursday.  Has had 2 UTI in last 3 months and feels same. NAD. VSS

## 2017-07-06 NOTE — ED Provider Notes (Signed)
[facility] EMERGENCY DEPARTMENT Provider Note   CSN: [id number] Arrival date & time: 07/06/17  1694     History   Chief Complaint Chief Complaint  Patient presents with  . Urinary Frequency  . Dysuria    HPI Erin Ellis is a 27 y.o. female.  Presents to the emergency department for evaluation of 3 days of dysuria.  She has had increase in urinary frequency, burning with urination as well as cloudiness and odor.  She denies any back pain, nausea, vomiting, fevers.  She feels pressure in the pelvic area with urination.  She denies any vaginal discharge.  Last urinary tract infection was 4 months ago.  HPI  Past Medical History:  Diagnosis Date  . Abortion    In 2015 (8 weeks, 4 days pregnant)  . Anxiety   . Complication of anesthesia   . Depression    anxiety, depression- on Wellbutrtin   . Headache    sees a neurologist on Aug 3, occurrs everday whole head, even without preg  . Low blood potassium 07/2015   treated with Potassium in ER  . Medical history non-contributory   . Miscarriage   . PONV (postoperative nausea and vomiting)     Patient Active Problem List   Diagnosis Date Noted  . Cholestasis of pregnancy in third trimester 12/05/2015  . Intrahepatic cholestasis of pregnancy, antepartum 10/31/2015  . Chronic tension-type headache, not intractable 10/26/2015  . Pregnancy related back pain in third trimester, antepartum 10/26/2015  . Labor and delivery affected by abnormality of maternal pelvic organs 09/23/2015  . Threatened preterm labor 09/18/2015  . Abdominal pain affecting pregnancy, antepartum 09/08/2015  . Depression affecting pregnancy in third trimester, antepartum 09/01/2015  . H/O migraine 07/31/2015  . Tobacco use affecting pregnancy in second trimester, antepartum 07/04/2015  . Supervision of other high risk pregnancies, third trimester 06/05/2015  . Anxiety and depression 11/01/2014  . Asthma 11/01/2014  . Supervision  of normal pregnancy in first trimester 11/01/2014  . Tobacco abuse 11/01/2014  . Anxiety 04/19/2013  . History of ADHD 04/19/2013    Past Surgical History:  Procedure Laterality Date  . ABDOMINAL EXPLORATION SURGERY    . TUBAL LIGATION N/A 12/07/2015   Procedure: POST PARTUM TUBAL LIGATION;  Surgeon: Christeen Douglas, MD;  Location: [facility] ORS;  Service: Gynecology;  Laterality: N/A;     OB History    Gravida  9   Para  2   Term  2   Preterm  0   AB  6   Living  2     SAB  5   TAB  1   Ectopic  0   Multiple  0   Live Births  2            Home Medications    Prior to Admission medications   Medication Sig Start Date End Date Taking? Authorizing Provider  amoxicillin (AMOXIL) 500 MG tablet Take 1 tablet (500 mg total) by mouth 3 (three) times daily. 06/02/16   Triplett, Rulon Eisenmenger B, FNP  ibuprofen (ADVIL,MOTRIN) 600 MG tablet Take 1 tablet (600 mg total) by mouth every 6 (six) hours. 12/08/15   Sigmon, Scarlette Slice, CNM  naproxen (NAPROSYN) 500 MG tablet Take 1 tablet (500 mg total) by mouth 2 (two) times daily with a meal. 06/02/16   Triplett, Cari B, FNP  nitrofurantoin, macrocrystal-monohydrate, (MACROBID) 100 MG capsule Take 1 capsule (100 mg total) by mouth 2 (two) times daily for 5 days. 07/06/17  07/11/17  Evon Slack, PA-C  Prenatal Vit-Fe Fumarate-FA (PRENATAL MULTIVITAMIN) TABS tablet Take 1 tablet by mouth daily at 12 noon.    [provider]    Family History Family History  Problem Relation Age of Onset  . Hypertension Mother   . Mental illness Mother   . Alcohol abuse Father   . Mental illness Father     Social History Social History   Tobacco Use  . Smoking status: Former Smoker    Packs/day: 1.00    Types: Cigarettes    Last attempt to quit: 09/11/2015    Years since quitting: 1.8  . Smokeless tobacco: Never Used  Substance Use Topics  . Alcohol use: No  . Drug use: No     Allergies   Sulfa antibiotics   Review of  Systems Review of Systems  Constitutional: Negative for fever.  Respiratory: Negative for shortness of breath.   Cardiovascular: Negative for chest pain.  Gastrointestinal: Negative for abdominal pain.  Genitourinary: Positive for dysuria, frequency and urgency. Negative for difficulty urinating and vaginal discharge.  Musculoskeletal: Negative for back pain and myalgias.  Skin: Negative for rash.  Neurological: Negative for dizziness and headaches.     Physical Exam Updated Vital Signs BP 121/70   Pulse 74   Temp 98 F (36.7 C) (Oral)   Resp 16   Ht 5\' 3"  (1.6 m)   Wt 56.7 kg (125 lb)   LMP 06/18/2017 (Approximate)   SpO2 99%   BMI 22.14 kg/m   Physical Exam  Constitutional: She is oriented to person, place, and time. She appears well-developed and well-nourished.  HENT:  Head: Normocephalic and atraumatic.  Eyes: Conjunctivae are normal.  Neck: Normal range of motion.  Cardiovascular: Normal rate.  Pulmonary/Chest: Effort normal. No respiratory distress.  Abdominal: Soft. She exhibits no distension. There is no tenderness. There is no guarding.  No CVA tenderness bilaterally.  Musculoskeletal: Normal range of motion.  Neurological: She is alert and oriented to person, place, and time.  Skin: Skin is warm. No rash noted.  Psychiatric: She has a normal mood and affect. Her behavior is normal. Thought content normal.     ED Treatments / Results  Labs (all labs ordered are listed, but only abnormal results are displayed) Labs Reviewed  URINALYSIS, COMPLETE (UACMP) WITH MICROSCOPIC - Abnormal; Notable for the following components:      Result Value   Color, Urine ORANGE (*)    APPearance CLOUDY (*)    Glucose, UA   (*)    Value: TEST NOT REPORTED DUE TO COLOR INTERFERENCE OF URINE PIGMENT   Hgb urine dipstick   (*)    Value: TEST NOT REPORTED DUE TO COLOR INTERFERENCE OF URINE PIGMENT   Bilirubin Urine   (*)    Value: TEST NOT REPORTED DUE TO COLOR INTERFERENCE  OF URINE PIGMENT   Ketones, ur   (*)    Value: TEST NOT REPORTED DUE TO COLOR INTERFERENCE OF URINE PIGMENT   Protein, ur   (*)    Value: TEST NOT REPORTED DUE TO COLOR INTERFERENCE OF URINE PIGMENT   Nitrite   (*)    Value: TEST NOT REPORTED DUE TO COLOR INTERFERENCE OF URINE PIGMENT   Leukocytes, UA   (*)    Value: TEST NOT REPORTED DUE TO COLOR INTERFERENCE OF URINE PIGMENT   Bacteria, UA FEW (*)    Squamous Epithelial / LPF 0-5 (*)    All other components within normal limits  URINE  CULTURE  POCT PREGNANCY, URINE    EKG None  Radiology No results found.  Procedures Procedures (including critical care time)  Medications Ordered in ED Medications - No data to display   Initial Impression / Assessment and Plan / ED Course  I have reviewed the triage vital signs and the nursing notes.  Pertinent labs & imaging results that were available during my care of the patient were reviewed by me and considered in my medical decision making (see chart for details).     27 year old female with acute urinary tract infection.  Urinalysis indicative of UTI.  Patient had been taking Azo.  Patient started on Macrobid.  She is educated on signs symptoms return to ED for.  Final Clinical Impressions(s) / ED Diagnoses   Final diagnoses:  Dysuria  Acute cystitis without hematuria    ED Discharge Orders        Ordered    nitrofurantoin, macrocrystal-monohydrate, (MACROBID) 100 MG capsule  2 times daily     07/06/17 0939       Evon Slack, PA-C 07/06/17 0940    Darci Current, MD 07/06/17 1013

## 2017-07-06 NOTE — Discharge Instructions (Addendum)
Please take antibiotics as prescribed.  Increase fluids.  Return to the ED for any increasing pain, back pain, fevers, nausea vomiting, worsening symptoms or urgent changes in health.

## 2017-07-06 NOTE — ED Notes (Signed)

## 2017-07-08 LAB — URINE CULTURE
Culture: 70000 — AB
Special Requests: NORMAL

## 2017-07-18 ENCOUNTER — Emergency Department
Admission: EM | Admit: 2017-07-18 | Discharge: 2017-07-18 | Disposition: A | Discharge status: Home / Self Care | Payer: Medicaid Other | Attending: Emergency Medicine | Admitting: Emergency Medicine

## 2017-07-18 ENCOUNTER — Other Ambulatory Visit: Payer: Self-pay

## 2017-07-18 DIAGNOSIS — Z79899 Other long term (current) drug therapy: Secondary | ICD-10-CM | POA: Diagnosis not present

## 2017-07-18 DIAGNOSIS — R300 Dysuria: Secondary | ICD-10-CM | POA: Diagnosis present

## 2017-07-18 DIAGNOSIS — J45909 Unspecified asthma, uncomplicated: Secondary | ICD-10-CM | POA: Insufficient documentation

## 2017-07-18 DIAGNOSIS — N390 Urinary tract infection, site not specified: Secondary | ICD-10-CM | POA: Diagnosis not present

## 2017-07-18 DIAGNOSIS — Z87891 Personal history of nicotine dependence: Secondary | ICD-10-CM | POA: Diagnosis not present

## 2017-07-18 LAB — URINALYSIS, ROUTINE W REFLEX MICROSCOPIC
BACTERIA UA: NONE SEEN
Bilirubin Urine: NEGATIVE
Glucose, UA: NEGATIVE mg/dL
Ketones, ur: NEGATIVE mg/dL
Nitrite: NEGATIVE
PROTEIN: 30 mg/dL — AB
Specific Gravity, Urine: 1.026 (ref 1.005–1.030)
pH: 5 (ref 5.0–8.0)

## 2017-07-18 LAB — POCT PREGNANCY, URINE: PREG TEST UR: NEGATIVE

## 2017-07-18 MED ORDER — PHENAZOPYRIDINE HCL 200 MG PO TABS
200.0000 mg | ORAL_TABLET | Freq: Once | ORAL | Status: AC
Start: 2017-07-18 — End: 2017-07-18
  Administered 2017-07-18: 200 mg via ORAL
  Filled 2017-07-18: qty 1

## 2017-07-18 MED ORDER — CEPHALEXIN 500 MG PO CAPS
500.0000 mg | ORAL_CAPSULE | Freq: Once | ORAL | Status: AC
  Administered 2017-07-18: 500 mg via ORAL
  Filled 2017-07-18: qty 1

## 2017-07-18 MED ORDER — PHENAZOPYRIDINE HCL 200 MG PO TABS: 200.0000 mg | ORAL_TABLET | Freq: Three times a day (TID) | ORAL | 0 refills | Status: DC | PRN

## 2017-07-18 MED ORDER — CEPHALEXIN 500 MG PO CAPS: 500.0000 mg | ORAL_CAPSULE | Freq: Three times a day (TID) | ORAL | 0 refills | Status: DC

## 2017-07-18 NOTE — ED Provider Notes (Signed)
[facility] Emergency Department Provider Note   ____________________________________________   First MD Initiated Contact with Patient 07/18/17 2310     (approximate)  I have reviewed the triage vital signs and the nursing notes.   HISTORY  Chief Complaint Pelvic Pain    HPI Erin Ellis is a 27 y.o. female who presents to the ED from home with a chief complaint of dysuria and suprapubic discomfort.  Patient was treated with Macrobid for UTI approximately 2 weeks ago.  She was also taking Azo.  States her symptoms of dysuria have not completely gone away and now she is also experiencing suprapubic abdominal discomfort.  Denies associated fever, chills, chest pain, shortness of breath, nausea, vomiting, diarrhea.  Denies vaginal bleeding or discharge.  Denies recent travel or trauma.   Past Medical History:  Diagnosis Date  . Abortion    In 2015 (8 weeks, 4 days pregnant)  . Anxiety   . Complication of anesthesia   . Depression    anxiety, depression- on Wellbutrtin   . Headache    sees a neurologist on Aug 3, occurrs everday whole head, even without preg  . Low blood potassium 07/2015   treated with Potassium in ER  . Medical history non-contributory   . Miscarriage   . PONV (postoperative nausea and vomiting)     Patient Active Problem List   Diagnosis Date Noted  . Cholestasis of pregnancy in third trimester 12/05/2015  . Intrahepatic cholestasis of pregnancy, antepartum 10/31/2015  . Chronic tension-type headache, not intractable 10/26/2015  . Pregnancy related back pain in third trimester, antepartum 10/26/2015  . Labor and delivery affected by abnormality of maternal pelvic organs 09/23/2015  . Threatened preterm labor 09/18/2015  . Abdominal pain affecting pregnancy, antepartum 09/08/2015  . Depression affecting pregnancy in third trimester, antepartum 09/01/2015  . H/O migraine 07/31/2015  . Tobacco use affecting pregnancy in  second trimester, antepartum 07/04/2015  . Supervision of other high risk pregnancies, third trimester 06/05/2015  . Anxiety and depression 11/01/2014  . Asthma 11/01/2014  . Supervision of normal pregnancy in first trimester 11/01/2014  . Tobacco abuse 11/01/2014  . Anxiety 04/19/2013  . History of ADHD 04/19/2013    Past Surgical History:  Procedure Laterality Date  . ABDOMINAL EXPLORATION SURGERY    . TUBAL LIGATION N/A 12/07/2015   Procedure: POST PARTUM TUBAL LIGATION;  Surgeon: Christeen Douglas, MD;  Location: [facility] ORS;  Service: Gynecology;  Laterality: N/A;    Prior to Admission medications   Medication Sig Start Date End Date Taking? Authorizing Provider  amoxicillin (AMOXIL) 500 MG tablet Take 1 tablet (500 mg total) by mouth 3 (three) times daily. 06/02/16   Triplett, Cari B, FNP  cephALEXin (KEFLEX) 500 MG capsule Take 1 capsule (500 mg total) by mouth 3 (three) times daily. 07/18/17   Irean Hong, MD  ibuprofen (ADVIL,MOTRIN) 600 MG tablet Take 1 tablet (600 mg total) by mouth every 6 (six) hours. 12/08/15   Sigmon, Scarlette Slice, CNM  naproxen (NAPROSYN) 500 MG tablet Take 1 tablet (500 mg total) by mouth 2 (two) times daily with a meal. 06/02/16   Triplett, Cari B, FNP  phenazopyridine (PYRIDIUM) 200 MG tablet Take 1 tablet (200 mg total) by mouth 3 (three) times daily as needed for pain. 07/18/17   Irean Hong, MD  Prenatal Vit-Fe Fumarate-FA (PRENATAL MULTIVITAMIN) TABS tablet Take 1 tablet by mouth daily at 12 noon.    [provider]    Allergies  Sulfa antibiotics  Family History  Problem Relation Age of Onset  . Hypertension Mother   . Mental illness Mother   . Alcohol abuse Father   . Mental illness Father     Social History Social History   Tobacco Use  . Smoking status: Former Smoker    Packs/day: 1.00    Types: Cigarettes    Last attempt to quit: 09/11/2015    Years since quitting: 1.8  . Smokeless tobacco: Never Used  Substance Use Topics  .  Alcohol use: No  . Drug use: No    Review of Systems  Constitutional: No fever/chills. Eyes: No visual changes. ENT: No sore throat. Cardiovascular: Denies chest pain. Respiratory: Denies shortness of breath. Gastrointestinal: No abdominal pain.  No nausea, no vomiting.  No diarrhea.  No constipation. Genitourinary: Positive for dysuria. Musculoskeletal: Negative for back pain. Skin: Negative for rash. Neurological: Negative for headaches, focal weakness or numbness.   ____________________________________________   PHYSICAL EXAM:  VITAL SIGNS: ED Triage Vitals  Enc Vitals Group     BP 07/18/17 2200 113/82     Pulse Rate 07/18/17 2200 93     Resp 07/18/17 2200 18     Temp 07/18/17 2200 98.2 F (36.8 C)     Temp src --      SpO2 07/18/17 2200 100 %     Weight --      Height --      Head Circumference --      Peak Flow --      Pain Score 07/18/17 2159 8     Pain Loc --      Pain Edu? --      Excl. in GC? --     Constitutional: Alert and oriented. Well appearing and in no acute distress. Eyes: Conjunctivae are normal. PERRL. EOMI. Head: Atraumatic. Nose: No congestion/rhinnorhea. Mouth/Throat: Mucous membranes are moist.  Oropharynx non-erythematous. Neck: No stridor.   Cardiovascular: Normal rate, regular rhythm. Grossly normal heart sounds.  Good peripheral circulation. Respiratory: Normal respiratory effort.  No retractions. Lungs CTAB. Gastrointestinal: Soft and nontender to light or deep palpation. No distention. No abdominal bruits. No CVA tenderness. Musculoskeletal: No lower extremity tenderness nor edema.  No joint effusions. Neurologic:  Normal speech and language. No gross focal neurologic deficits are appreciated. No gait instability. Skin:  Skin is warm, dry and intact. No rash noted. Psychiatric: Mood and affect are normal. Speech and behavior are normal.  ____________________________________________   LABS (all labs ordered are listed, but only  abnormal results are displayed)  Labs Reviewed  URINALYSIS, ROUTINE W REFLEX MICROSCOPIC - Abnormal; Notable for the following components:      Result Value   Color, Urine AMBER (*)    APPearance HAZY (*)    Hgb urine dipstick SMALL (*)    Protein, ur 30 (*)    Leukocytes, UA SMALL (*)    WBC, UA >50 (*)    All other components within normal limits  URINE CULTURE  POCT PREGNANCY, URINE  POC URINE PREG, ED   ____________________________________________  EKG  None ____________________________________________  RADIOLOGY  ED MD interpretation: None  Official radiology report(s): No results found.  ____________________________________________   PROCEDURES  Procedure(s) performed: None  Procedures  Critical Care performed: No  ____________________________________________   INITIAL IMPRESSION / ASSESSMENT AND PLAN / ED COURSE  As part of my medical decision making, I reviewed the following data within the electronic MEDICAL RECORD NUMBER Nursing notes reviewed and incorporated, Labs reviewed, Old  chart reviewed and Notes from prior ED visits   27 year old female recently treated with Macrobid for UTI who presents with continued dysuria and suprapubic abdominal discomfort.  Differential diagnosis includes but is not limited to cystitis, pyelonephritis, ureteral colic, vaginal infection, etc.   Urinalysis demonstrates small leukocytes with greater than 50 WBC.  Will send for culture and start patient on Keflex as well as Pyridium for urinary discomfort.  Strict return precautions given.  Patient verbalizes understanding and agrees with plan of care.      ____________________________________________   FINAL CLINICAL IMPRESSION(S) / ED DIAGNOSES  Final diagnoses:  Lower urinary tract infectious disease  Dysuria     ED Discharge Orders        Ordered    cephALEXin (KEFLEX) 500 MG capsule  3 times daily     07/18/17 2326    phenazopyridine (PYRIDIUM) 200 MG  tablet  3 times daily PRN     07/18/17 2326       Note:  This document was prepared using Dragon voice recognition software and may include unintentional dictation errors.    Irean Hong, MD 07/19/17 [phone]

## 2017-07-18 NOTE — Discharge Instructions (Addendum)
1.  Take antibiotic as prescribed (Keflex 500 mg 3 times daily for 7 days). 2.  You may take urinary pain medicine as prescribed (Pyridium #6). 3.  Urine culture is pending.  You will be notified of any positive results requiring a change in your antibiotic. 4.  Drink plenty of fluids daily. 5.  Return to the ER for worsening symptoms, persistent vomiting, fever, or other concerns.

## 2017-07-18 NOTE — ED Triage Notes (Signed)
Reports dx'd with UTI approximately 2 weeks ago.  States she took antibiotics as prescribed, reports feel like symptoms have returned.

## 2017-07-18 NOTE — ED Notes (Signed)
Pt states was here last week for a uti. Pt states she now has back pain, frequent bloody urination. Pt states she does not feel like her uti is resolving. Pt appears in no acute distress.

## 2017-07-21 ENCOUNTER — Other Ambulatory Visit: Payer: Self-pay | Admitting: Family Medicine

## 2017-07-21 DIAGNOSIS — N63 Unspecified lump in unspecified breast: Secondary | ICD-10-CM

## 2017-07-21 LAB — URINE CULTURE: Culture: 10000 — AB

## 2017-07-23 ENCOUNTER — Inpatient Hospital Stay: Admission: RE | Admit: 2017-07-23 | Payer: Medicaid Other | Source: Ambulatory Visit

## 2017-11-17 DIAGNOSIS — R7989 Other specified abnormal findings of blood chemistry: Secondary | ICD-10-CM | POA: Diagnosis not present

## 2017-11-17 DIAGNOSIS — Z862 Personal history of diseases of the blood and blood-forming organs and certain disorders involving the immune mechanism: Secondary | ICD-10-CM | POA: Diagnosis not present

## 2017-11-17 DIAGNOSIS — R52 Pain, unspecified: Secondary | ICD-10-CM | POA: Diagnosis not present

## 2017-11-19 DIAGNOSIS — R768 Other specified abnormal immunological findings in serum: Secondary | ICD-10-CM | POA: Diagnosis not present

## 2017-12-10 DIAGNOSIS — M2550 Pain in unspecified joint: Secondary | ICD-10-CM | POA: Diagnosis not present

## 2017-12-10 DIAGNOSIS — H04123 Dry eye syndrome of bilateral lacrimal glands: Secondary | ICD-10-CM | POA: Diagnosis not present

## 2017-12-10 DIAGNOSIS — R631 Polydipsia: Secondary | ICD-10-CM | POA: Diagnosis not present

## 2017-12-10 DIAGNOSIS — R768 Other specified abnormal immunological findings in serum: Secondary | ICD-10-CM | POA: Diagnosis not present

## 2017-12-10 DIAGNOSIS — E049 Nontoxic goiter, unspecified: Secondary | ICD-10-CM | POA: Diagnosis not present

## 2017-12-11 ENCOUNTER — Other Ambulatory Visit: Payer: Self-pay | Admitting: Internal Medicine

## 2017-12-11 DIAGNOSIS — E049 Nontoxic goiter, unspecified: Secondary | ICD-10-CM

## 2017-12-12 DIAGNOSIS — R319 Hematuria, unspecified: Secondary | ICD-10-CM | POA: Diagnosis not present

## 2017-12-12 DIAGNOSIS — Z113 Encounter for screening for infections with a predominantly sexual mode of transmission: Secondary | ICD-10-CM | POA: Diagnosis not present

## 2017-12-12 DIAGNOSIS — Z0000 Encounter for general adult medical examination without abnormal findings: Secondary | ICD-10-CM | POA: Diagnosis not present

## 2017-12-17 ENCOUNTER — Ambulatory Visit
Admission: RE | Admit: 2017-12-17 | Discharge: 2017-12-17 | Disposition: A | Discharge status: Home / Self Care | Payer: Medicaid Other | Source: Ambulatory Visit | Attending: Internal Medicine | Admitting: Internal Medicine

## 2017-12-17 DIAGNOSIS — E042 Nontoxic multinodular goiter: Secondary | ICD-10-CM | POA: Diagnosis not present

## 2017-12-17 DIAGNOSIS — E049 Nontoxic goiter, unspecified: Secondary | ICD-10-CM | POA: Insufficient documentation

## 2017-12-22 ENCOUNTER — Ambulatory Visit: Payer: Medicaid Other

## 2017-12-22 DIAGNOSIS — H04123 Dry eye syndrome of bilateral lacrimal glands: Secondary | ICD-10-CM | POA: Diagnosis not present

## 2017-12-22 DIAGNOSIS — M2550 Pain in unspecified joint: Secondary | ICD-10-CM | POA: Diagnosis not present

## 2017-12-22 DIAGNOSIS — R208 Other disturbances of skin sensation: Secondary | ICD-10-CM | POA: Diagnosis not present

## 2017-12-22 DIAGNOSIS — R2689 Other abnormalities of gait and mobility: Secondary | ICD-10-CM | POA: Diagnosis not present

## 2018-04-23 ENCOUNTER — Other Ambulatory Visit: Payer: Self-pay

## 2018-04-23 ENCOUNTER — Encounter: Payer: Self-pay | Admitting: Emergency Medicine

## 2018-04-23 ENCOUNTER — Emergency Department
Admission: EM | Admit: 2018-04-23 | Discharge: 2018-04-23 | Disposition: A | Discharge status: Home / Self Care | Payer: Medicaid Other | Attending: Emergency Medicine | Admitting: Emergency Medicine

## 2018-04-23 DIAGNOSIS — R300 Dysuria: Secondary | ICD-10-CM | POA: Diagnosis present

## 2018-04-23 DIAGNOSIS — N390 Urinary tract infection, site not specified: Secondary | ICD-10-CM | POA: Insufficient documentation

## 2018-04-23 DIAGNOSIS — F17210 Nicotine dependence, cigarettes, uncomplicated: Secondary | ICD-10-CM | POA: Insufficient documentation

## 2018-04-23 LAB — URINALYSIS, COMPLETE (UACMP) WITH MICROSCOPIC
Bilirubin Urine: NEGATIVE
Glucose, UA: NEGATIVE mg/dL
HGB URINE DIPSTICK: NEGATIVE
KETONES UR: NEGATIVE mg/dL
Leukocytes, UA: NEGATIVE
NITRITE: POSITIVE — AB
PROTEIN: NEGATIVE mg/dL
Specific Gravity, Urine: 1.019 (ref 1.005–1.030)
pH: 7 (ref 5.0–8.0)

## 2018-04-23 LAB — PREGNANCY, URINE: PREG TEST UR: NEGATIVE

## 2018-04-23 MED ORDER — PHENAZOPYRIDINE HCL 200 MG PO TABS: 200.0000 mg | ORAL_TABLET | Freq: Three times a day (TID) | ORAL | 0 refills | Status: DC | PRN

## 2018-04-23 MED ORDER — NITROFURANTOIN MONOHYD MACRO 100 MG PO CAPS: 100.0000 mg | ORAL_CAPSULE | Freq: Two times a day (BID) | ORAL | 0 refills | Status: DC

## 2018-04-23 MED ORDER — NITROFURANTOIN MONOHYD MACRO 100 MG PO CAPS
100.0000 mg | ORAL_CAPSULE | Freq: Once | ORAL | Status: AC
  Administered 2018-04-23: 100 mg via ORAL
  Filled 2018-04-23: qty 1

## 2018-04-23 MED ORDER — PHENAZOPYRIDINE HCL 200 MG PO TABS
200.0000 mg | ORAL_TABLET | Freq: Once | ORAL | Status: AC
  Administered 2018-04-23: 200 mg via ORAL
  Filled 2018-04-23: qty 1

## 2018-04-23 MED ORDER — NITROFURANTOIN MONOHYD MACRO 100 MG PO CAPS: 100.0000 mg | ORAL_CAPSULE | Freq: Two times a day (BID) | ORAL | 0 refills | Status: AC

## 2018-04-23 MED ORDER — PHENAZOPYRIDINE HCL 200 MG PO TABS: 200.0000 mg | ORAL_TABLET | Freq: Three times a day (TID) | ORAL | 0 refills | Status: AC | PRN

## 2018-04-23 NOTE — ED Notes (Signed)
EDP at bedside to assess pt 

## 2018-04-23 NOTE — ED Provider Notes (Signed)
[facility] : [facility] Emergency Department Provider Note  Time seen: 7:17 PM  I have reviewed the triage vital signs and the nursing notes.   HISTORY  Chief Complaint Flank Pain    HPI Erin Ellis is a 28 y.o. female with a past medical history of anxiety, depression, presents to the emergency department for dysuria and a foul odor.  According to the patient for the past 2 days she has noticed a slight dysuria when urinating as well as mild urgency to urinate.  Patient states most notably she has noticed a foul smell to her urine.  Patient states a history of urinary tract infections in the past which this feels very similar.  States today she is experience a little bit of left flank pain as well.  No hematuria noted.  Denies any vaginal bleeding or discharge.  Patient is married and does not believe she is at any risk for STDs.  Denies any nausea vomiting or diarrhea.   Past Medical History:  Diagnosis Date  . Abortion    In 2015 (8 weeks, 4 days pregnant)  . Anxiety   . Complication of anesthesia   . Depression    anxiety, depression- on Wellbutrtin   . Headache    sees a neurologist on Aug 3, occurrs everday whole head, even without preg  . Low blood potassium 07/2015   treated with Potassium in ER  . Medical history non-contributory   . Miscarriage   . PONV (postoperative nausea and vomiting)     Patient Active Problem List   Diagnosis Date Noted  . Cholestasis of pregnancy in third trimester 12/05/2015  . Intrahepatic cholestasis of pregnancy, antepartum 10/31/2015  . Chronic tension-type headache, not intractable 10/26/2015  . Pregnancy related back pain in third trimester, antepartum 10/26/2015  . Labor and delivery affected by abnormality of maternal pelvic organs 09/23/2015  . Threatened preterm labor 09/18/2015  . Abdominal pain affecting pregnancy, antepartum 09/08/2015  . Depression affecting pregnancy in third trimester, antepartum 09/01/2015   . H/O migraine 07/31/2015  . Tobacco use affecting pregnancy in second trimester, antepartum 07/04/2015  . Supervision of other high risk pregnancies, third trimester 06/05/2015  . Anxiety and depression 11/01/2014  . Asthma 11/01/2014  . Supervision of normal pregnancy in first trimester 11/01/2014  . Tobacco abuse 11/01/2014  . Anxiety 04/19/2013  . History of ADHD 04/19/2013    Past Surgical History:  Procedure Laterality Date  . ABDOMINAL EXPLORATION SURGERY    . TUBAL LIGATION N/A 12/07/2015   Procedure: POST PARTUM TUBAL LIGATION;  Surgeon: Christeen DouglasBethany Beasley, MD;  Location: [facility] ORS;  Service: Gynecology;  Laterality: N/A;    Prior to Admission medications   Medication Sig Start Date End Date Taking? Authorizing Provider  amoxicillin (AMOXIL) 500 MG tablet Take 1 tablet (500 mg total) by mouth 3 (three) times daily. 06/02/16   Triplett, Cari B, FNP  cephALEXin (KEFLEX) 500 MG capsule Take 1 capsule (500 mg total) by mouth 3 (three) times daily. 07/18/17   Irean HongSung, Jade J, MD  ibuprofen (ADVIL,MOTRIN) 600 MG tablet Take 1 tablet (600 mg total) by mouth every 6 (six) hours. 12/08/15   Sigmon, Scarlette SliceMeredith C, CNM  naproxen (NAPROSYN) 500 MG tablet Take 1 tablet (500 mg total) by mouth 2 (two) times daily with a meal. 06/02/16   Triplett, Cari B, FNP  phenazopyridine (PYRIDIUM) 200 MG tablet Take 1 tablet (200 mg total) by mouth 3 (three) times daily as needed for pain. 07/18/17   Dolores FrameSung,  [place] SinkJade J, MD  Prenatal Vit-Fe Fumarate-FA (PRENATAL MULTIVITAMIN) TABS tablet Take 1 tablet by mouth daily at 12 noon.    [provider]    Allergies  Allergen Reactions  . Sulfa Antibiotics Hives, Swelling and Rash    Unknown reaction    Family History  Problem Relation Age of Onset  . Hypertension Mother   . Mental illness Mother   . Alcohol abuse Father   . Mental illness Father     Social History Social History   Tobacco Use  . Smoking status: Current Every Day Smoker    Packs/day: 0.50     Types: Cigarettes    Last attempt to quit: 09/11/2015    Years since quitting: 2.6  . Smokeless tobacco: Never Used  Substance Use Topics  . Alcohol use: No  . Drug use: No    Review of Systems Constitutional: Negative for fever. Cardiovascular: Negative for chest pain. Respiratory: Negative for shortness of breath. Gastrointestinal: Slight left flank pain today.  Negative for nausea vomiting or diarrhea Genitourinary: Mild dysuria, mild urgency, foul odor to her urine. Musculoskeletal: Negative for musculoskeletal complaints Neurological: Negative for headache All other ROS negative  ____________________________________________   PHYSICAL EXAM:  VITAL SIGNS: ED Triage Vitals  Enc Vitals Group     BP 04/23/18 1901 127/64     Pulse Rate 04/23/18 1901 75     Resp 04/23/18 1901 16     Temp --      Temp Source 04/23/18 1901 Oral     SpO2 04/23/18 1901 99 %     Weight 04/23/18 1902 135 lb (61.2 kg)     Height 04/23/18 1902 5\' 3"  (1.6 m)     Head Circumference --      Peak Flow --      Pain Score 04/23/18 1902 0     Pain Loc --      Pain Edu? --      Excl. in GC? --    Constitutional: Alert and oriented. Well appearing and in no distress. Eyes: Normal exam ENT   Head: Normocephalic and atraumatic.   Mouth/Throat: Mucous membranes are moist. Cardiovascular: Normal rate, regular rhythm. No murmur Respiratory: Normal respiratory effort without tachypnea nor retractions. Breath sounds are clear Gastrointestinal: Soft, mild suprapubic tenderness.  No rebound guarding.  No distention.  Very slight left CVA tenderness. Musculoskeletal: Nontender with normal range of motion in all extremities.  Neurologic:  Normal speech and language. No gross focal neurologic deficits  Skin:  Skin is warm, dry and intact.  Psychiatric: Mood and affect are normal.   ____________________________________________   INITIAL IMPRESSION / ASSESSMENT AND PLAN / ED COURSE  Pertinent  labs & imaging results that were available during my care of the patient were reviewed by me and considered in my medical decision making (see chart for details).  Patient presents to the emergency department for dysuria, urgency and a foul odor of her urine.  Patient has slight suprapubic and left CVA tenderness to palpation.  Differential would include urinary tract infection, pyelonephritis, ureterolithiasis.  Patient denies any vaginal bleeding or discharge.  Does not believe she is at any risk for STDs.  We will check urinalysis and continue to closely monitor.  Urine is nitrite positive.  We will start the patient on Macrobid twice daily for 7 days.  Patient agreeable to plan of care.  We will follow-up with her doctor.  ____________________________________________   FINAL CLINICAL IMPRESSION(S) / ED DIAGNOSES  Urinary tract  infection   Minna Antis, MD 04/23/18 2013

## 2018-04-23 NOTE — ED Triage Notes (Signed)
Pt in via POV, reports left flank pain, dysuria, urinary frequency and urgency x a few days.  Vitals WDL, NAD noted at this time.

## 2018-04-26 LAB — URINE CULTURE

## 2018-05-08 ENCOUNTER — Emergency Department: Payer: Medicaid Other

## 2018-05-08 ENCOUNTER — Other Ambulatory Visit: Payer: Self-pay

## 2018-05-08 ENCOUNTER — Encounter: Payer: Self-pay | Admitting: Emergency Medicine

## 2018-05-08 ENCOUNTER — Emergency Department
Admission: EM | Admit: 2018-05-08 | Discharge: 2018-05-08 | Disposition: A | Discharge status: Home / Self Care | Payer: Medicaid Other | Attending: Emergency Medicine | Admitting: Emergency Medicine

## 2018-05-08 DIAGNOSIS — R0600 Dyspnea, unspecified: Secondary | ICD-10-CM | POA: Diagnosis not present

## 2018-05-08 DIAGNOSIS — M545 Low back pain: Secondary | ICD-10-CM | POA: Diagnosis present

## 2018-05-08 DIAGNOSIS — F17210 Nicotine dependence, cigarettes, uncomplicated: Secondary | ICD-10-CM | POA: Insufficient documentation

## 2018-05-08 DIAGNOSIS — K8050 Calculus of bile duct without cholangitis or cholecystitis without obstruction: Secondary | ICD-10-CM | POA: Insufficient documentation

## 2018-05-08 DIAGNOSIS — K8020 Calculus of gallbladder without cholecystitis without obstruction: Secondary | ICD-10-CM | POA: Diagnosis not present

## 2018-05-08 LAB — URINALYSIS, COMPLETE (UACMP) WITH MICROSCOPIC
Bacteria, UA: NONE SEEN
Bilirubin Urine: NEGATIVE
Glucose, UA: NEGATIVE mg/dL
Hgb urine dipstick: NEGATIVE
Ketones, ur: NEGATIVE mg/dL
Leukocytes,Ua: NEGATIVE
Nitrite: NEGATIVE
Protein, ur: NEGATIVE mg/dL
Specific Gravity, Urine: 1.017 (ref 1.005–1.030)
pH: 7 (ref 5.0–8.0)

## 2018-05-08 LAB — POCT PREGNANCY, URINE: PREG TEST UR: NEGATIVE

## 2018-05-08 MED ORDER — KETOROLAC TROMETHAMINE 10 MG PO TABS: 10.0000 mg | ORAL_TABLET | Freq: Four times a day (QID) | ORAL | 0 refills | Status: AC | PRN

## 2018-05-08 MED ORDER — KETOROLAC TROMETHAMINE 30 MG/ML IJ SOLN
30.0000 mg | Freq: Once | INTRAMUSCULAR | Status: AC
  Administered 2018-05-08: 30 mg via INTRAMUSCULAR
  Filled 2018-05-08: qty 1

## 2018-05-08 MED ORDER — CEFTRIAXONE SODIUM 1 G IJ SOLR
1.0000 g | Freq: Once | INTRAMUSCULAR | Status: AC
  Administered 2018-05-08: 1 g via INTRAMUSCULAR
  Filled 2018-05-08: qty 10

## 2018-05-08 MED ORDER — CEPHALEXIN 500 MG PO CAPS: 500.0000 mg | ORAL_CAPSULE | Freq: Three times a day (TID) | ORAL | 0 refills | Status: AC

## 2018-05-08 MED ORDER — LIDOCAINE HCL (PF) 1 % IJ SOLN
5.0000 mL | Freq: Once | INTRAMUSCULAR | Status: AC
  Administered 2018-05-08: 5 mL
  Filled 2018-05-08: qty 5

## 2018-05-08 NOTE — ED Triage Notes (Signed)
Presents with right flank and mid back pain  Pain started this am  Pain increases with movement and breathing

## 2018-05-08 NOTE — ED Provider Notes (Signed)
[facility] Emergency Department Provider Note  ____________________________________________  Time seen: Approximately 4:08 PM  I have reviewed the triage vital signs and the nursing notes.   HISTORY  Chief Complaint Back Pain    HPI Erin Ellis is a 28 y.o. female presents to the emergency department with 10 out of 10 right-sided low back pain that became apparent this morning when patient reportedly walked outside.  Patient reports that pain radiates up her back along her right upper arm and stops at mid upper arm.  Patient was treated approximately 2 weeks ago for cystitis with Macrobid.  Patient reports that she took medication as directed.  Urinary frequency improved but patient reports that dysuria never resolved.  Patient has had no suprapubic or abdominal pain but reports that she has had nausea and chills today.  No hematuria.  Patient denies any type of concern for STDs as she is in a monogamous relationship with her husband and they are faithful to each other.  No changes in vaginal discharge or dyspareunia.  Patient denies a history of nephrolithiasis and reports that she is a heavy [employer] drinker.  Patient states that she has had multiple episodes of cystitis since getting married and patient does not always urinate after sex.  No other alleviating measures have been attempted.   Past Medical History:  Diagnosis Date  . Abortion    In 2015 (8 weeks, 4 days pregnant)  . Anxiety   . Complication of anesthesia   . Depression    anxiety, depression- on Wellbutrtin   . Headache    sees a neurologist on Aug 3, occurrs everday whole head, even without preg  . Low blood potassium 07/2015   treated with Potassium in ER  . Medical history non-contributory   . Miscarriage   . PONV (postoperative nausea and vomiting)     Patient Active Problem List   Diagnosis Date Noted  . Cholestasis of pregnancy in third trimester 12/05/2015  . Intrahepatic  cholestasis of pregnancy, antepartum 10/31/2015  . Chronic tension-type headache, not intractable 10/26/2015  . Pregnancy related back pain in third trimester, antepartum 10/26/2015  . Labor and delivery affected by abnormality of maternal pelvic organs 09/23/2015  . Threatened preterm labor 09/18/2015  . Abdominal pain affecting pregnancy, antepartum 09/08/2015  . Depression affecting pregnancy in third trimester, antepartum 09/01/2015  . H/O migraine 07/31/2015  . Tobacco use affecting pregnancy in second trimester, antepartum 07/04/2015  . Supervision of other high risk pregnancies, third trimester 06/05/2015  . Anxiety and depression 11/01/2014  . Asthma 11/01/2014  . Supervision of normal pregnancy in first trimester 11/01/2014  . Tobacco abuse 11/01/2014  . Anxiety 04/19/2013  . History of ADHD 04/19/2013    Past Surgical History:  Procedure Laterality Date  . ABDOMINAL EXPLORATION SURGERY    . TUBAL LIGATION N/A 12/07/2015   Procedure: POST PARTUM TUBAL LIGATION;  Surgeon: Christeen Douglas, MD;  Location: [facility] ORS;  Service: Gynecology;  Laterality: N/A;    Prior to Admission medications   Medication Sig Start Date End Date Taking? Authorizing Provider  cephALEXin (KEFLEX) 500 MG capsule Take 1 capsule (500 mg total) by mouth 3 (three) times daily for 7 days. 05/08/18 05/15/18  Orvil Feil, PA-C  ketorolac (TORADOL) 10 MG tablet Take 1 tablet (10 mg total) by mouth every 6 (six) hours as needed for up to 5 days. 05/08/18 05/13/18  Orvil Feil, PA-C  phenazopyridine (PYRIDIUM) 200 MG tablet Take 1 tablet (200  mg total) by mouth 3 (three) times daily as needed for pain. 04/23/18 04/23/19  Minna AntisPaduchowski, Kevin, MD    Allergies Sulfa antibiotics  Family History  Problem Relation Age of Onset  . Hypertension Mother   . Mental illness Mother   . Alcohol abuse Father   . Mental illness Father     Social History Social History   Tobacco Use  . Smoking status: Current Every  Day Smoker    Packs/day: 0.50    Types: Cigarettes    Last attempt to quit: 09/11/2015    Years since quitting: 2.6  . Smokeless tobacco: Never Used  Substance Use Topics  . Alcohol use: No  . Drug use: No     Review of Systems  Constitutional: Patient has chills.  Eyes: No visual changes. No discharge ENT: No upper respiratory complaints. Cardiovascular: no chest pain. Respiratory: no cough. No SOB. Gastrointestinal: No abdominal pain. Patient has nausea. No diarrhea.  No constipation. Genitourinary: Patient has dysuria. No hematuria Musculoskeletal: Negative for musculoskeletal pain. Skin: Negative for rash, abrasions, lacerations, ecchymosis. Neurological: Negative for headaches, focal weakness or numbness.   ____________________________________________   PHYSICAL EXAM:  VITAL SIGNS: ED Triage Vitals  Enc Vitals Group     BP 05/08/18 1457 123/78     Pulse Rate 05/08/18 1457 84     Resp 05/08/18 1457 18     Temp 05/08/18 1457 98.4 F (36.9 C)     Temp Source 05/08/18 1457 Oral     SpO2 05/08/18 1457 100 %     Weight 05/08/18 1458 132 lb (59.9 kg)     Height 05/08/18 1458 5\' 3"  (1.6 m)     Head Circumference --      Peak Flow --      Pain Score 05/08/18 1458 8     Pain Loc --      Pain Edu? --      Excl. in GC? --      Constitutional: Alert and oriented. Well appearing and in no acute distress. Eyes: Conjunctivae are normal. PERRL. EOMI. Head: Atraumatic. ENT:      Ears: TMs are pearly.       Nose: No congestion/rhinnorhea.      Mouth/Throat: Mucous membranes are moist.  Neck: No stridor.  No cervical spine tenderness to palpation. Cardiovascular: Normal rate, regular rhythm. Normal S1 and S2.  Good peripheral circulation. Respiratory: Normal respiratory effort without tachypnea or retractions. Lungs CTAB. Good air entry to the bases with no decreased or absent breath sounds. Gastrointestinal: Bowel sounds 4 quadrants.  Patient has tenderness to palpation  right upper quadrant.  No guarding or rigidity. No palpable masses. No distention. Patient has right sided CVA tenderness. Musculoskeletal: Full range of motion to all extremities. No gross deformities appreciated. Neurologic:  Normal speech and language. No gross focal neurologic deficits are appreciated.  Skin:  Skin is warm, dry and intact. No rash noted. Psychiatric: Mood and affect are normal. Speech and behavior are normal. Patient exhibits appropriate insight and judgement.   ____________________________________________   LABS (all labs ordered are listed, but only abnormal results are displayed)  Labs Reviewed  URINALYSIS, COMPLETE (UACMP) WITH MICROSCOPIC - Abnormal; Notable for the following components:      Result Value   Color, Urine YELLOW (*)    APPearance HAZY (*)    All other components within normal limits  POCT PREGNANCY, URINE   ____________________________________________  EKG   ____________________________________________  RADIOLOGY I personally viewed and evaluated these  images as part of my medical decision making, as well as reviewing the written report by the radiologist.    Dg Chest 2 View  Result Date: 05/08/2018 CLINICAL DATA:  28 y/o F; mid back/side pain that increases with movement and breathing. EXAM: CHEST - 2 VIEW COMPARISON:  01/28/2012 chest radiograph FINDINGS: Stable heart size and mediastinal contours are within normal limits. Both lungs are clear. The visualized skeletal structures are unremarkable. IMPRESSION: No acute pulmonary process identified. Electronically Signed   By: Mitzi Hansen M.D.   On: 05/08/2018 16:41   Ct Renal Stone Study  Result Date: 05/08/2018 CLINICAL DATA:  Acute right flank pain. EXAM: CT ABDOMEN AND PELVIS WITHOUT CONTRAST TECHNIQUE: Multidetector CT imaging of the abdomen and pelvis was performed following the standard protocol without IV contrast. COMPARISON:  CT scan of November 18, 2011. FINDINGS:  Lower chest: No acute abnormality. Hepatobiliary: Cholelithiasis is noted without evidence of cholecystitis. No biliary dilatation is noted. No focal hepatic abnormality is noted on these unenhanced images. Pancreas: Unremarkable. No pancreatic ductal dilatation or surrounding inflammatory changes. Spleen: Normal in size without focal abnormality. Adrenals/Urinary Tract: Adrenal glands are unremarkable. Kidneys are normal, without renal calculi, focal lesion, or hydronephrosis. Bladder is unremarkable. Stomach/Bowel: Stomach is within normal limits. Appendix appears normal. No evidence of bowel wall thickening, distention, or inflammatory changes. Vascular/Lymphatic: No significant vascular findings are present. No enlarged abdominal or pelvic lymph nodes. Reproductive: Uterus and bilateral adnexa are unremarkable. Other: Small amount of free fluid is noted in the pelvis which may be physiologic. Musculoskeletal: No acute or significant osseous findings. IMPRESSION: Cholelithiasis. No hydronephrosis or renal obstruction is noted. No renal or ureteral calculi are noted. No other abnormality seen in the abdomen or pelvis. Electronically Signed   By: Lupita Raider, M.D.   On: 05/08/2018 16:50    ____________________________________________    PROCEDURES  Procedure(s) performed:    Procedures    Medications  cefTRIAXone (ROCEPHIN) injection 1 g (1 g Intramuscular Given 05/08/18 1736)  ketorolac (TORADOL) 30 MG/ML injection 30 mg (30 mg Intramuscular Given 05/08/18 1736)  lidocaine (PF) (XYLOCAINE) 1 % injection 5 mL (5 mLs Other Given 05/08/18 1736)     ____________________________________________   INITIAL IMPRESSION / ASSESSMENT AND PLAN / ED COURSE  Pertinent labs & imaging results that were available during my care of the patient were reviewed by me and considered in my medical decision making (see chart for details).  Review of the [place] CSRS was performed in accordance of the [facility] prior  to dispensing any controlled drugs.      Assessment and Plan:  Biliary Colic:  Differential diagnosis includes cystitis, nephrolithiasis, pyelonephritis, community-acquired pneumonia and PE.  Urinalysis was noncontributory for cystitis, no leukocytes, blood or nitrates.  CT renal stone study indicated cholelithiasis but no findings to suggest cholecystitis.  Chest x-ray revealed no consolidations, opacities or infiltrates that suggest community-acquired pneumonia.  Patient has had no recent travel, prolonged immobilization or use of birth control.  Low suspicion for PE.  Biliary colic is likely at this time.  Patient was given an injection of Toradol in the emergency department and she was discharged with Toradol.  Given patient's history of frequent urinary tract infections with persistent dysuria and nausea/chills, I cannot rule out pyelonephritis.  Patient was given Rocephin in the emergency department and she was discharged with Keflex.  A referral to GI was given for follow-up of biliary colic.    ____________________________________________  FINAL CLINICAL IMPRESSION(S) / ED DIAGNOSES  Final diagnoses:  Biliary colic      NEW MEDICATIONS STARTED DURING THIS VISIT:  ED Discharge Orders         Ordered    cephALEXin (KEFLEX) 500 MG capsule  3 times daily     05/08/18 1723    ketorolac (TORADOL) 10 MG tablet  Every 6 hours PRN     05/08/18 1725              This chart was dictated using voice recognition software/Dragon. Despite best efforts to proofread, errors can occur which can change the meaning. Any change was purely unintentional.    Orvil Feil, PA-C 05/08/18 1753    Sharyn Creamer, MD 05/09/18 [phone]

## 2018-05-14 DIAGNOSIS — K8020 Calculus of gallbladder without cholecystitis without obstruction: Secondary | ICD-10-CM | POA: Diagnosis not present

## 2018-05-19 DIAGNOSIS — R6889 Other general symptoms and signs: Secondary | ICD-10-CM | POA: Diagnosis not present

## 2018-06-22 DIAGNOSIS — R208 Other disturbances of skin sensation: Secondary | ICD-10-CM | POA: Diagnosis not present

## 2018-08-27 DIAGNOSIS — Z1159 Encounter for screening for other viral diseases: Secondary | ICD-10-CM | POA: Diagnosis not present

## 2018-09-11 DIAGNOSIS — N3000 Acute cystitis without hematuria: Secondary | ICD-10-CM | POA: Diagnosis not present

## 2018-10-13 DIAGNOSIS — R51 Headache: Secondary | ICD-10-CM | POA: Diagnosis not present

## 2018-10-13 DIAGNOSIS — R208 Other disturbances of skin sensation: Secondary | ICD-10-CM | POA: Diagnosis not present

## 2018-10-13 DIAGNOSIS — M2550 Pain in unspecified joint: Secondary | ICD-10-CM | POA: Diagnosis not present

## 2018-10-13 DIAGNOSIS — R202 Paresthesia of skin: Secondary | ICD-10-CM | POA: Diagnosis not present

## 2018-10-15 ENCOUNTER — Other Ambulatory Visit: Payer: Self-pay | Admitting: Neurology

## 2018-10-15 DIAGNOSIS — G35 Multiple sclerosis: Secondary | ICD-10-CM

## 2018-10-28 ENCOUNTER — Ambulatory Visit: Admission: RE | Admit: 2018-10-28 | Payer: Medicaid Other | Source: Ambulatory Visit

## 2019-01-01 DIAGNOSIS — R300 Dysuria: Secondary | ICD-10-CM | POA: Diagnosis not present

## 2019-03-09 DIAGNOSIS — Z20828 Contact with and (suspected) exposure to other viral communicable diseases: Secondary | ICD-10-CM | POA: Diagnosis not present

## 2019-03-18 DIAGNOSIS — H5213 Myopia, bilateral: Secondary | ICD-10-CM | POA: Diagnosis not present

## 2019-05-10 DIAGNOSIS — Z20828 Contact with and (suspected) exposure to other viral communicable diseases: Secondary | ICD-10-CM | POA: Diagnosis not present

## 2019-05-23 ENCOUNTER — Emergency Department: Payer: Medicaid Other

## 2019-05-23 ENCOUNTER — Other Ambulatory Visit: Payer: Self-pay

## 2019-05-23 ENCOUNTER — Emergency Department
Admission: EM | Admit: 2019-05-23 | Discharge: 2019-05-23 | Disposition: A | Discharge status: Home / Self Care | Payer: Medicaid Other | Attending: Emergency Medicine | Admitting: Emergency Medicine

## 2019-05-23 DIAGNOSIS — Y929 Unspecified place or not applicable: Secondary | ICD-10-CM | POA: Insufficient documentation

## 2019-05-23 DIAGNOSIS — X58XXXA Exposure to other specified factors, initial encounter: Secondary | ICD-10-CM | POA: Diagnosis not present

## 2019-05-23 DIAGNOSIS — M542 Cervicalgia: Secondary | ICD-10-CM | POA: Diagnosis not present

## 2019-05-23 DIAGNOSIS — Y939 Activity, unspecified: Secondary | ICD-10-CM | POA: Insufficient documentation

## 2019-05-23 DIAGNOSIS — F17210 Nicotine dependence, cigarettes, uncomplicated: Secondary | ICD-10-CM | POA: Insufficient documentation

## 2019-05-23 DIAGNOSIS — S199XXA Unspecified injury of neck, initial encounter: Secondary | ICD-10-CM | POA: Diagnosis present

## 2019-05-23 DIAGNOSIS — M436 Torticollis: Secondary | ICD-10-CM | POA: Diagnosis not present

## 2019-05-23 DIAGNOSIS — S161XXA Strain of muscle, fascia and tendon at neck level, initial encounter: Secondary | ICD-10-CM | POA: Diagnosis not present

## 2019-05-23 DIAGNOSIS — M4802 Spinal stenosis, cervical region: Secondary | ICD-10-CM | POA: Diagnosis not present

## 2019-05-23 DIAGNOSIS — Y999 Unspecified external cause status: Secondary | ICD-10-CM | POA: Insufficient documentation

## 2019-05-23 LAB — CBC WITH DIFFERENTIAL/PLATELET
Abs Immature Granulocytes: 0.01 10*3/uL (ref 0.00–0.07)
Basophils Absolute: 0 10*3/uL (ref 0.0–0.1)
Basophils Relative: 1 %
Eosinophils Absolute: 0.2 10*3/uL (ref 0.0–0.5)
Eosinophils Relative: 3 %
HCT: 42.3 % (ref 36.0–46.0)
Hemoglobin: 14.3 g/dL (ref 12.0–15.0)
Immature Granulocytes: 0 %
Lymphocytes Relative: 28 %
Lymphs Abs: 1.5 10*3/uL (ref 0.7–4.0)
MCH: 29.4 pg (ref 26.0–34.0)
MCHC: 33.8 g/dL (ref 30.0–36.0)
MCV: 87 fL (ref 80.0–100.0)
Monocytes Absolute: 0.4 10*3/uL (ref 0.1–1.0)
Monocytes Relative: 7 %
Neutro Abs: 3.3 10*3/uL (ref 1.7–7.7)
Neutrophils Relative %: 61 %
Platelets: 252 10*3/uL (ref 150–400)
RBC: 4.86 MIL/uL (ref 3.87–5.11)
RDW: 12.8 % (ref 11.5–15.5)
WBC: 5.3 10*3/uL (ref 4.0–10.5)
nRBC: 0 % (ref 0.0–0.2)

## 2019-05-23 LAB — BASIC METABOLIC PANEL
Anion gap: 7 (ref 5–15)
BUN: 14 mg/dL (ref 6–20)
CO2: 26 mmol/L (ref 22–32)
Calcium: 9.3 mg/dL (ref 8.9–10.3)
Chloride: 106 mmol/L (ref 98–111)
Creatinine, Ser: 0.66 mg/dL (ref 0.44–1.00)
GFR calc Af Amer: >60 mL/min (ref >60)
GFR calc non Af Amer: >60 mL/min (ref >60)
Glucose, Bld: 96 mg/dL (ref 70–99)
Potassium: 3.8 mmol/L (ref 3.5–5.1)
Sodium: 139 mmol/L (ref 135–145)

## 2019-05-23 MED ORDER — ORPHENADRINE CITRATE 30 MG/ML IJ SOLN
60.0000 mg | INTRAMUSCULAR | Status: DC
  Filled 2019-05-23: qty 2

## 2019-05-23 MED ORDER — KETOROLAC TROMETHAMINE 30 MG/ML IJ SOLN
30.0000 mg | Freq: Once | INTRAMUSCULAR | Status: AC
  Administered 2019-05-23: 30 mg via INTRAVENOUS
  Filled 2019-05-23: qty 1

## 2019-05-23 MED ORDER — ORPHENADRINE CITRATE 30 MG/ML IJ SOLN
60.0000 mg | Freq: Two times a day (BID) | INTRAMUSCULAR | Status: DC
  Administered 2019-05-23: 60 mg via INTRAVENOUS

## 2019-05-23 MED ORDER — PREDNISONE 20 MG PO TABS: ORAL_TABLET | ORAL | 0 refills | Status: AC

## 2019-05-23 MED ORDER — DEXAMETHASONE SODIUM PHOSPHATE 10 MG/ML IJ SOLN
10.0000 mg | Freq: Once | INTRAMUSCULAR | Status: AC
  Administered 2019-05-23: 10 mg via INTRAVENOUS
  Filled 2019-05-23: qty 1

## 2019-05-23 MED ORDER — HYDROCODONE-ACETAMINOPHEN 5-325 MG PO TABS: 1.0000 | ORAL_TABLET | Freq: Three times a day (TID) | ORAL | 0 refills | Status: AC | PRN

## 2019-05-23 MED ORDER — CYCLOBENZAPRINE HCL 5 MG PO TABS: 5.0000 mg | ORAL_TABLET | Freq: Three times a day (TID) | ORAL | 0 refills | Status: AC | PRN

## 2019-05-23 MED ORDER — KETOROLAC TROMETHAMINE 10 MG PO TABS: 10.0000 mg | ORAL_TABLET | Freq: Three times a day (TID) | ORAL | 0 refills | Status: AC

## 2019-05-23 MED ORDER — LIDOCAINE 5 % EX PTCH: 1.0000 | MEDICATED_PATCH | CUTANEOUS | 0 refills | Status: AC

## 2019-05-23 NOTE — Discharge Instructions (Signed)
Your exam, labs, and MRI are all reassuring at this time. You do not have any indication of a serious infection like meningitis. You have an MRI that does not show a herniated disk. You will be treated with steroids, muscle relaxants, and pain medicine. Follow-up with Dr. Adriana Simas for further evaluation of your symptoms. Return to the ED as needed.

## 2019-05-23 NOTE — ED Provider Notes (Signed)
[facility] Emergency Department Provider Note ____________________________________________  Time seen: 1243  I have reviewed the triage vital signs and the nursing notes.  HISTORY  Chief Complaint  Back Pain and Neck Pain  HPI Erin Ellis is a 29 y.o. female with a history of asthma, anxiety, depression , and ADHD, presents herself to the ED for evaluation of progressive back pain and neck pain.  Patient describes last week she began to experience pain in the lower back.  Since that time the pain has progressed upwards towards her neck.  She presents today with pain in the neck and inability to move the head around and left-sided neck and upper back pain. She denies any preceding injury, fall, or trauma. She is without fevers, chills, sweats, rash, nausea, or headache. She does admit to a cough, mild chest tightness, and weakness with UE movement. She has been taking OTC acetaminophen daily and ibuprofen initially. She denies any recent illness or antibiotics. She takes no daily medicines and denies any sick contacts.   Past Medical History:  Diagnosis Date  . Abortion    In 2015 (8 weeks, 4 days pregnant)  . Anxiety   . Complication of anesthesia   . Depression    anxiety, depression- on Wellbutrtin   . Headache    sees a neurologist on Aug 3, occurrs everday whole head, even without preg  . Low blood potassium 07/2015   treated with Potassium in ER  . Medical history non-contributory   . Miscarriage   . PONV (postoperative nausea and vomiting)     Patient Active Problem List   Diagnosis Date Noted  . Cholestasis of pregnancy in third trimester 12/05/2015  . Intrahepatic cholestasis of pregnancy, antepartum 10/31/2015  . Chronic tension-type headache, not intractable 10/26/2015  . Pregnancy related back pain in third trimester, antepartum 10/26/2015  . Labor and delivery affected by abnormality of maternal pelvic organs 09/23/2015  . Threatened  preterm labor 09/18/2015  . Abdominal pain affecting pregnancy, antepartum 09/08/2015  . Depression affecting pregnancy in third trimester, antepartum 09/01/2015  . H/O migraine 07/31/2015  . Tobacco use affecting pregnancy in second trimester, antepartum 07/04/2015  . Supervision of other high risk pregnancies, third trimester 06/05/2015  . Anxiety and depression 11/01/2014  . Asthma 11/01/2014  . Supervision of normal pregnancy in first trimester 11/01/2014  . Tobacco abuse 11/01/2014  . Anxiety 04/19/2013  . History of ADHD 04/19/2013    Past Surgical History:  Procedure Laterality Date  . ABDOMINAL EXPLORATION SURGERY    . TUBAL LIGATION N/A 12/07/2015   Procedure: POST PARTUM TUBAL LIGATION;  Surgeon: Christeen Douglas, MD;  Location: [facility] ORS;  Service: Gynecology;  Laterality: N/A;    Prior to Admission medications   Medication Sig Start Date End Date Taking? Authorizing Provider  cyclobenzaprine (FLEXERIL) 5 MG tablet Take 1 tablet (5 mg total) by mouth 3 (three) times daily as needed. 05/23/19   Hutton Pellicane, Charlesetta Ivory, PA-C  HYDROcodone-acetaminophen (NORCO) 5-325 MG tablet Take 1 tablet by mouth 3 (three) times daily as needed for up to 3 days. 05/23/19 05/26/19  Khalif Stender, Charlesetta Ivory, PA-C  ketorolac (TORADOL) 10 MG tablet Take 1 tablet (10 mg total) by mouth every 8 (eight) hours. 05/23/19   Aysiah Jurado, Charlesetta Ivory, PA-C  lidocaine (LIDODERM) 5 % Place 1 patch onto the skin daily for 5 days. Remove & Discard patch after 12 hours of wear each day. 05/23/19 05/28/19  Nishawn Rotan, Charlesetta Ivory, PA-C  predniSONE (DELTASONE) 20 MG tablet Take 3 tabs daily x 3 day; Take 2 tabs daily x 3 days; Take 1 tab daily x 3 days; Take 0.5 tabs daily x 4 days 05/24/19   Birdia Jaycox, Charlesetta Ivory, PA-C    Allergies Sulfa antibiotics  Family History  Problem Relation Age of Onset  . Hypertension Mother   . Mental illness Mother   . Alcohol abuse Father   . Mental illness Father     Social  History Social History   Tobacco Use  . Smoking status: Current Every Day Smoker    Packs/day: 0.50    Types: Cigarettes    Last attempt to quit: 09/11/2015    Years since quitting: 3.6  . Smokeless tobacco: Never Used  Substance Use Topics  . Alcohol use: No  . Drug use: No    Review of Systems  Constitutional: Negative for fever. Eyes: Negative for visual changes. ENT: Negative for sore throat. Cardiovascular: Negative for chest pain. Respiratory: Negative for shortness of breath. Gastrointestinal: Negative for abdominal pain, vomiting and diarrhea. Genitourinary: Negative for dysuria. Musculoskeletal: Negative for back pain. Reports left neck pain and stiffness Skin: Negative for rash. Neurological: Negative for headaches, focal weakness or numbness. ____________________________________________  PHYSICAL EXAM:  VITAL SIGNS: ED Triage Vitals  Enc Vitals Group     BP 05/23/19 1229 117/84     Pulse Rate 05/23/19 1229 78     Resp 05/23/19 1229 18     Temp 05/23/19 1229 98.5 F (36.9 C)     Temp src --      SpO2 05/23/19 1229 100 %     Weight 05/23/19 1230 131 lb (59.4 kg)     Height 05/23/19 1230 5\' 3"  (1.6 m)     Head Circumference --      Peak Flow --      Pain Score 05/23/19 1230 10     Pain Loc --      Pain Edu? --      Excl. in GC? --     Constitutional: Alert and oriented. Well appearing and in no distress. GCS=15 Head: Normocephalic and atraumatic. Eyes: Conjunctivae are normal. PERRL. Normal extraocular movements and fundi bilaterally.  Ears: Canals clear. TMs intact bilaterally. Nose: No congestion/rhinorrhea/epistaxis. Mouth/Throat: Mucous membranes are moist. Neck: Supple. Non tender, stable thyromegaly. No midline tenderness. Exquisite tenderness to light touch over the left lateral neck and upper trapezius musculature.  Hematological/Lymphatic/Immunological: No cervical lymphadenopathy. Cardiovascular: Normal rate, regular rhythm. Normal distal  pulses. Respiratory: Normal respiratory effort. No wheezes/rales/rhonchi. Gastrointestinal: Soft and nontender. No distention. Musculoskeletal: Normal spinal alignment without significant midline tenderness, spasm, vomiting, or step-off.  Patient is exquisitely tender to palpation to the left scapulothoracic region.  She is also tender to palpation over the left cervical thoracic region.  Cervical range of motion is limited secondary to patient's subjective complaints of pain.  Normal composite fist bilaterally.  Nontender with normal range of motion in all extremities.  Neurologic: Cranial nerves II through XII grossly intact.  Normal LE DTRs bilaterally.  Normal gait without ataxia. Normal speech and language. Negative supine/sitting Kernig sign. Negative Brudzinski sign. No gross focal neurologic deficits are appreciated. Skin:  Skin is warm, dry and intact. No rash noted. Psychiatric: Mood and affect are normal. Patient exhibits appropriate insight and judgment. ____________________________________________   LABS (pertinent positives/negatives) Labs Reviewed  CULTURE, BLOOD (ROUTINE X 2)  CULTURE, BLOOD (ROUTINE X 2)  CBC WITH DIFFERENTIAL/PLATELET  BASIC METABOLIC PANEL  URINALYSIS, COMPLETE (  UACMP) WITH MICROSCOPIC  POC URINE PREG, ED  ____________________________________________   RADIOLOGY  MRI Cervical Spine IMPRESSION: Small disc protrusions at C5-6 and C6-7 resulting in mild spinal stenosis, more notable at C6-7 ____________________________________________  PROCEDURES  Toradol 30 mg IVP Norflex 60 mg IVP Decadron 10 mg IVP Procedures ____________________________________________  INITIAL IMPRESSION / ASSESSMENT AND PLAN / ED COURSE  DDX: meningitis, cervical strain, myofascial pain, torticollis, cervical radiculopathy  Patient with ED evaluation of lower back pain that progressed to neck stiffness.  She denied any headache, fevers, recent illness, or other red flags  concerning for meningitis other than neck stiffness. Her exam was reassuring for any meningeal signs. Her labs did not indicate an acute infection or anemia. Her MRI is reassuring as it does not an acute HNP or spinal cord compression. She is reporting  Improvement of her symptoms following medication administration in the ED. She is able to move fluidly at the time of this disposition. She is discharged with a prescription for steroid, ketorolac, cyclobenzaprine, and hydrocodone. She is referred to Neurology for ongoing management.  ----------------------------------------- 6:27 PM on 05/23/2019 ----------------------------------------- Patient was asked to secure a ride home given the fact we would be treating her pain with potentially sedating medications.  She is asking at this time if she is safe to drive herself home as she has not called for a ride.  Advised the nurse to the patient that she would need to either call for a ride, or wait in the ED for an additional 2 hours until this expected half-life of the medication given has been reached.   Erin Ellis was evaluated in Emergency Department on 05/23/2019 for the symptoms described in the history of present illness. She was evaluated in the context of the global COVID-19 pandemic, which necessitated consideration that the patient might be at risk for infection with the SARS-CoV-2 virus that causes COVID-19. Institutional protocols and algorithms that pertain to the evaluation of patients at risk for COVID-19 are in a state of rapid change based on information released by regulatory bodies including the CDC and federal and state organizations. These policies and algorithms were followed during the patient's care in the ED. ____________________________________________  FINAL CLINICAL IMPRESSION(S) / ED DIAGNOSES  Final diagnoses:  Torticollis  Acute strain of neck muscle, initial encounter      Melvenia Needles, PA-C 05/23/19  2048    Arta Silence, MD 05/24/19 1501

## 2019-05-23 NOTE — ED Notes (Addendum)
Pt verbalized understanding of discharge instructions. NAD at this time. Pt called friend to pick her up due to medications given while in the ED.

## 2019-05-23 NOTE — ED Notes (Signed)
Pt given phone to talk with MRI for screening questions

## 2019-05-23 NOTE — ED Triage Notes (Signed)
Pt states last week her lower back started hurting and has progressively gone up her back onto her neck and to her left side. Pt unable to raise left arm or move neck around. Pt states no relief with OTC pain relief. Pt denies any injury.

## 2019-05-25 ENCOUNTER — Other Ambulatory Visit: Payer: Self-pay | Admitting: Acute Care

## 2019-05-25 ENCOUNTER — Other Ambulatory Visit (HOSPITAL_COMMUNITY): Payer: Self-pay | Admitting: Acute Care

## 2019-05-25 DIAGNOSIS — G35 Multiple sclerosis: Secondary | ICD-10-CM

## 2019-05-25 DIAGNOSIS — R202 Paresthesia of skin: Secondary | ICD-10-CM | POA: Diagnosis not present

## 2019-05-26 DIAGNOSIS — Z79899 Other long term (current) drug therapy: Secondary | ICD-10-CM | POA: Diagnosis not present

## 2019-05-26 DIAGNOSIS — Q998 Other specified chromosome abnormalities: Secondary | ICD-10-CM | POA: Diagnosis not present

## 2019-05-26 DIAGNOSIS — Z13220 Encounter for screening for lipoid disorders: Secondary | ICD-10-CM | POA: Diagnosis not present

## 2019-05-26 DIAGNOSIS — E038 Other specified hypothyroidism: Secondary | ICD-10-CM | POA: Diagnosis not present

## 2019-05-26 DIAGNOSIS — E519 Thiamine deficiency, unspecified: Secondary | ICD-10-CM | POA: Diagnosis not present

## 2019-05-26 DIAGNOSIS — Z130 Encounter for screening for diseases of the blood and blood-forming organs and certain disorders involving the immune mechanism: Secondary | ICD-10-CM | POA: Diagnosis not present

## 2019-05-26 DIAGNOSIS — E538 Deficiency of other specified B group vitamins: Secondary | ICD-10-CM | POA: Diagnosis not present

## 2019-05-26 DIAGNOSIS — E559 Vitamin D deficiency, unspecified: Secondary | ICD-10-CM | POA: Diagnosis not present

## 2019-05-26 DIAGNOSIS — M2550 Pain in unspecified joint: Secondary | ICD-10-CM | POA: Diagnosis not present

## 2019-05-26 DIAGNOSIS — R208 Other disturbances of skin sensation: Secondary | ICD-10-CM | POA: Diagnosis not present

## 2019-05-28 DIAGNOSIS — E538 Deficiency of other specified B group vitamins: Secondary | ICD-10-CM | POA: Diagnosis not present

## 2019-05-28 LAB — CULTURE, BLOOD (ROUTINE X 2)
Culture: NO GROWTH
Culture: NO GROWTH
Special Requests: ADEQUATE

## 2019-05-31 ENCOUNTER — Other Ambulatory Visit: Payer: Self-pay

## 2019-05-31 ENCOUNTER — Ambulatory Visit
Admission: RE | Admit: 2019-05-31 | Discharge: 2019-05-31 | Disposition: A | Discharge status: Home / Self Care | Payer: Medicaid Other | Source: Ambulatory Visit | Attending: Acute Care | Admitting: Acute Care

## 2019-05-31 DIAGNOSIS — G35 Multiple sclerosis: Secondary | ICD-10-CM | POA: Insufficient documentation

## 2019-05-31 DIAGNOSIS — R519 Headache, unspecified: Secondary | ICD-10-CM | POA: Diagnosis not present

## 2019-05-31 MED ORDER — GADOBUTROL 1 MMOL/ML IV SOLN
5.0000 mL | Freq: Once | INTRAVENOUS | Status: AC | PRN
  Administered 2019-05-31: 5 mL via INTRAVENOUS

## 2019-06-01 DIAGNOSIS — F4310 Post-traumatic stress disorder, unspecified: Secondary | ICD-10-CM | POA: Diagnosis not present

## 2019-06-01 DIAGNOSIS — F39 Unspecified mood [affective] disorder: Secondary | ICD-10-CM | POA: Diagnosis not present

## 2019-06-02 ENCOUNTER — Telehealth: Payer: Self-pay | Admitting: *Deleted

## 2019-06-02 ENCOUNTER — Other Ambulatory Visit: Payer: Self-pay | Admitting: Acute Care

## 2019-06-02 DIAGNOSIS — G35 Multiple sclerosis: Secondary | ICD-10-CM

## 2019-06-04 NOTE — Progress Notes (Signed)
Pt notified of lumbar puncture instructions - pt is not on any blood thinners and is aware that she must have driver and will be in [place] 4 hours post procedure. Pt is requesting a note for work for day of procedure and post procedure day 1 as she works as a Electrical engineer with moderate physical exertion. Patient verbalized all understanding of instructions.

## 2019-06-09 ENCOUNTER — Other Ambulatory Visit: Payer: Self-pay

## 2019-06-09 ENCOUNTER — Ambulatory Visit
Admission: RE | Admit: 2019-06-09 | Discharge: 2019-06-09 | Disposition: A | Discharge status: Home / Self Care | Payer: Medicaid Other | Source: Ambulatory Visit | Attending: Acute Care | Admitting: Acute Care

## 2019-06-09 DIAGNOSIS — G35 Multiple sclerosis: Secondary | ICD-10-CM | POA: Insufficient documentation

## 2019-06-09 LAB — CSF CELL COUNT WITH DIFFERENTIAL
RBC Count, CSF: 16 /mm3 — ABNORMAL HIGH (ref 0–3)
Tube #: 3
WBC, CSF: 0 /mm3 (ref 0–5)

## 2019-06-09 LAB — PROTIME-INR
INR: 1 (ref 0.8–1.2)
Prothrombin Time: 12.9 seconds (ref 11.4–15.2)

## 2019-06-09 LAB — PROTEIN, CSF: Total  Protein, CSF: 20 mg/dL (ref 15–45)

## 2019-06-09 LAB — CBC
HCT: 40.1 % (ref 36.0–46.0)
Hemoglobin: 13.4 g/dL (ref 12.0–15.0)
MCH: 29.6 pg (ref 26.0–34.0)
MCHC: 33.4 g/dL (ref 30.0–36.0)
MCV: 88.7 fL (ref 80.0–100.0)
Platelets: 231 10*3/uL (ref 150–400)
RBC: 4.52 MIL/uL (ref 3.87–5.11)
RDW: 12.7 % (ref 11.5–15.5)
WBC: 7.1 10*3/uL (ref 4.0–10.5)
nRBC: 0 % (ref 0.0–0.2)

## 2019-06-09 LAB — GLUCOSE, CSF: Glucose, CSF: 66 mg/dL (ref 40–70)

## 2019-06-09 LAB — ALBUMIN: Albumin: 3.9 g/dL (ref 3.5–5.0)

## 2019-06-09 LAB — POCT PREGNANCY, URINE: Preg Test, Ur: NEGATIVE

## 2019-06-09 LAB — APTT: aPTT: 26 seconds (ref 24–36)

## 2019-06-09 MED ORDER — ACETAMINOPHEN 500 MG PO TABS
ORAL_TABLET | ORAL | Status: AC
  Filled 2019-06-09: qty 2

## 2019-06-09 MED ORDER — ACETAMINOPHEN 500 MG PO TABS
1000.0000 mg | ORAL_TABLET | Freq: Four times a day (QID) | ORAL | Status: DC | PRN
  Administered 2019-06-09: 1000 mg via ORAL

## 2019-06-11 LAB — IGG CSF INDEX
Albumin CSF-mCnc: 15 mg/dL (ref 11–48)
Albumin: 4.1 g/dL (ref 3.9–5.0)
CSF IgG Index: 0.4 (ref 0.0–0.7)
IgG (Immunoglobin G), Serum: 703 mg/dL (ref 586–1602)
IgG, CSF: 1.1 mg/dL (ref 0.0–8.6)
IgG/Alb Ratio, CSF: 0.07 (ref 0.00–0.25)

## 2019-06-12 LAB — CSF CULTURE W GRAM STAIN
Culture: NO GROWTH
Gram Stain: NONE SEEN

## 2019-06-14 LAB — OLIGOCLONAL BANDS, CSF + SERM

## 2019-06-22 DIAGNOSIS — R208 Other disturbances of skin sensation: Secondary | ICD-10-CM | POA: Diagnosis not present

## 2019-06-22 DIAGNOSIS — E519 Thiamine deficiency, unspecified: Secondary | ICD-10-CM | POA: Diagnosis not present

## 2019-06-22 DIAGNOSIS — R946 Abnormal results of thyroid function studies: Secondary | ICD-10-CM | POA: Diagnosis not present

## 2019-06-22 DIAGNOSIS — F419 Anxiety disorder, unspecified: Secondary | ICD-10-CM | POA: Diagnosis not present

## 2019-06-22 DIAGNOSIS — R270 Ataxia, unspecified: Secondary | ICD-10-CM | POA: Diagnosis not present

## 2019-06-24 DIAGNOSIS — E876 Hypokalemia: Secondary | ICD-10-CM | POA: Diagnosis not present

## 2019-06-24 DIAGNOSIS — R946 Abnormal results of thyroid function studies: Secondary | ICD-10-CM | POA: Diagnosis not present

## 2019-06-24 DIAGNOSIS — M545 Low back pain: Secondary | ICD-10-CM | POA: Diagnosis not present

## 2019-06-24 DIAGNOSIS — R200 Anesthesia of skin: Secondary | ICD-10-CM | POA: Diagnosis not present

## 2019-06-24 DIAGNOSIS — R202 Paresthesia of skin: Secondary | ICD-10-CM | POA: Diagnosis not present

## 2019-06-24 DIAGNOSIS — R29898 Other symptoms and signs involving the musculoskeletal system: Secondary | ICD-10-CM | POA: Diagnosis not present

## 2019-06-30 DIAGNOSIS — R200 Anesthesia of skin: Secondary | ICD-10-CM | POA: Diagnosis not present

## 2019-07-14 DIAGNOSIS — R270 Ataxia, unspecified: Secondary | ICD-10-CM | POA: Diagnosis not present

## 2019-07-14 DIAGNOSIS — F419 Anxiety disorder, unspecified: Secondary | ICD-10-CM | POA: Diagnosis not present

## 2019-07-14 DIAGNOSIS — R29898 Other symptoms and signs involving the musculoskeletal system: Secondary | ICD-10-CM | POA: Diagnosis not present

## 2019-07-14 DIAGNOSIS — R200 Anesthesia of skin: Secondary | ICD-10-CM | POA: Diagnosis not present

## 2019-11-12 DIAGNOSIS — R208 Other disturbances of skin sensation: Secondary | ICD-10-CM | POA: Diagnosis not present

## 2019-11-12 DIAGNOSIS — R7989 Other specified abnormal findings of blood chemistry: Secondary | ICD-10-CM | POA: Diagnosis not present

## 2019-11-12 DIAGNOSIS — M2550 Pain in unspecified joint: Secondary | ICD-10-CM | POA: Diagnosis not present

## 2019-11-26 ENCOUNTER — Emergency Department
Admission: EM | Admit: 2019-11-26 | Discharge: 2019-11-26 | Disposition: A | Discharge status: Home / Self Care | Payer: Medicaid Other | Attending: Emergency Medicine | Admitting: Emergency Medicine

## 2019-11-26 ENCOUNTER — Other Ambulatory Visit: Payer: Self-pay

## 2019-11-26 DIAGNOSIS — J45909 Unspecified asthma, uncomplicated: Secondary | ICD-10-CM | POA: Insufficient documentation

## 2019-11-26 DIAGNOSIS — Z79899 Other long term (current) drug therapy: Secondary | ICD-10-CM | POA: Insufficient documentation

## 2019-11-26 DIAGNOSIS — M791 Myalgia, unspecified site: Secondary | ICD-10-CM | POA: Diagnosis present

## 2019-11-26 DIAGNOSIS — F17210 Nicotine dependence, cigarettes, uncomplicated: Secondary | ICD-10-CM | POA: Insufficient documentation

## 2019-11-26 DIAGNOSIS — N390 Urinary tract infection, site not specified: Secondary | ICD-10-CM | POA: Insufficient documentation

## 2019-11-26 DIAGNOSIS — U071 COVID-19: Secondary | ICD-10-CM | POA: Diagnosis not present

## 2019-11-26 LAB — URINALYSIS, ROUTINE W REFLEX MICROSCOPIC: Specific Gravity, Urine: 1.039 — ABNORMAL HIGH (ref 1.005–1.030)

## 2019-11-26 LAB — GROUP A STREP BY PCR: Group A Strep by PCR: NOT DETECTED

## 2019-11-26 LAB — SARS CORONAVIRUS 2 BY RT PCR (HOSPITAL ORDER, PERFORMED IN ~~LOC~~ HOSPITAL LAB): SARS Coronavirus 2: POSITIVE — AB

## 2019-11-26 MED ORDER — PHENAZOPYRIDINE HCL 200 MG PO TABS: 200.0000 mg | ORAL_TABLET | Freq: Three times a day (TID) | ORAL | 0 refills | Status: AC | PRN

## 2019-11-26 MED ORDER — IBUPROFEN 600 MG PO TABS: 600.0000 mg | ORAL_TABLET | Freq: Three times a day (TID) | ORAL | 0 refills | Status: AC | PRN

## 2019-11-26 MED ORDER — KETOROLAC TROMETHAMINE 60 MG/2ML IM SOLN
60.0000 mg | Freq: Once | INTRAMUSCULAR | Status: AC
  Administered 2019-11-26: 60 mg via INTRAMUSCULAR
  Filled 2019-11-26: qty 2

## 2019-11-26 MED ORDER — NITROFURANTOIN MONOHYD MACRO 100 MG PO CAPS: 100.0000 mg | ORAL_CAPSULE | Freq: Two times a day (BID) | ORAL | 0 refills | Status: AC

## 2019-11-26 MED ORDER — PSEUDOEPH-BROMPHEN-DM 30-2-10 MG/5ML PO SYRP: 5.0000 mL | ORAL_SOLUTION | Freq: Four times a day (QID) | ORAL | 0 refills | Status: AC | PRN

## 2019-11-26 NOTE — ED Triage Notes (Signed)
Pt states that she started feeling bad on Wednesday, pt states that she possibly has had an exposure to covid, pt reports cough, feeling fatigued, body aches and also symptoms of a uti

## 2019-11-26 NOTE — ED Provider Notes (Signed)
[facility] Emergency Department Provider Note   ____________________________________________   First MD Initiated Contact with Patient 11/26/19 1017     (approximate)  I have reviewed the triage vital signs and the nursing notes.   HISTORY  Chief Complaint Cough and Dysuria    HPI Erin Ellis is a 29 y.o. female patient presents with body aches, sore throat, cough, and fatigue.  Patient state possible exposure to COVID-19.  Patient denies sore throat or loss of taste/smell.  Patient also complaining of urinary frequency and mild dysuria.  Patient is not taking the COVID-19 vaccine.  Patient rates her pain as 10/10.  Describes pain is "achy".  No palliative measure for complaint.         Past Medical History:  Diagnosis Date   Abortion    In 2015 (8 weeks, 4 days pregnant)   Anxiety    Complication of anesthesia    Depression    anxiety, depression- on Wellbutrtin    Headache    sees a neurologist on Aug 3, occurrs everday whole head, even without preg   Low blood potassium 07/2015   treated with Potassium in ER   Medical history non-contributory    Miscarriage    PONV (postoperative nausea and vomiting)     Patient Active Problem List   Diagnosis Date Noted   Cholestasis of pregnancy in third trimester 12/05/2015   Intrahepatic cholestasis of pregnancy, antepartum 10/31/2015   Chronic tension-type headache, not intractable 10/26/2015   Pregnancy related back pain in third trimester, antepartum 10/26/2015   Labor and delivery affected by abnormality of maternal pelvic organs 09/23/2015   Threatened preterm labor 09/18/2015   Abdominal pain affecting pregnancy, antepartum 09/08/2015   Depression affecting pregnancy in third trimester, antepartum 09/01/2015   H/O migraine 07/31/2015   Tobacco use affecting pregnancy in second trimester, antepartum 07/04/2015   Supervision of other high risk pregnancies, third  trimester 06/05/2015   Anxiety and depression 11/01/2014   Asthma 11/01/2014   Supervision of normal pregnancy in first trimester 11/01/2014   Tobacco abuse 11/01/2014   Anxiety 04/19/2013   History of ADHD 04/19/2013    Past Surgical History:  Procedure Laterality Date   ABDOMINAL EXPLORATION SURGERY     TUBAL LIGATION N/A 12/07/2015   Procedure: POST PARTUM TUBAL LIGATION;  Surgeon: Christeen Douglas, MD;  Location: [facility] ORS;  Service: Gynecology;  Laterality: N/A;    Prior to Admission medications   Medication Sig Start Date End Date Taking? Authorizing Provider  brompheniramine-pseudoephedrine-DM 30-2-10 MG/5ML syrup Take 5 mLs by mouth 4 (four) times daily as needed. 11/26/19   Joni Reining, PA-C  cyclobenzaprine (FLEXERIL) 5 MG tablet Take 1 tablet (5 mg total) by mouth 3 (three) times daily as needed. 05/23/19   Menshew, Charlesetta Ivory, PA-C  ibuprofen (ADVIL) 600 MG tablet Take 1 tablet (600 mg total) by mouth every 8 (eight) hours as needed. 11/26/19   Joni Reining, PA-C  ketorolac (TORADOL) 10 MG tablet Take 1 tablet (10 mg total) by mouth every 8 (eight) hours. 05/23/19   Menshew, Charlesetta Ivory, PA-C  nitrofurantoin, macrocrystal-monohydrate, (MACROBID) 100 MG capsule Take 1 capsule (100 mg total) by mouth 2 (two) times daily. 11/26/19   Joni Reining, PA-C  phenazopyridine (PYRIDIUM) 200 MG tablet Take 1 tablet (200 mg total) by mouth 3 (three) times daily as needed for pain. 11/26/19   Joni Reining, PA-C  predniSONE (DELTASONE) 20 MG tablet Take 3 tabs daily  x 3 day; Take 2 tabs daily x 3 days; Take 1 tab daily x 3 days; Take 0.5 tabs daily x 4 days 05/24/19   Menshew, Charlesetta Ivory, PA-C    Allergies Sulfa antibiotics  Family History  Problem Relation Age of Onset   Hypertension Mother    Mental illness Mother    Alcohol abuse Father    Mental illness Father     Social History Social History   Tobacco Use   Smoking status: Current Every Day Smoker     Packs/day: 0.50    Types: Cigarettes    Last attempt to quit: 09/11/2015    Years since quitting: 4.2   Smokeless tobacco: Never Used  Vaping Use   Vaping Use: Former  Substance Use Topics   Alcohol use: No   Drug use: No    Review of Systems Constitutional: No fever/chills Eyes: No visual changes. ENT: No sore throat. Cardiovascular: Denies chest pain. Respiratory: Denies shortness of breath. Gastrointestinal: No abdominal pain.  No nausea, no vomiting.  No diarrhea.  No constipation. Genitourinary: Negative for dysuria. Musculoskeletal: Negative for back pain. Skin: Negative for rash. Neurological: Negative for headaches, focal weakness or numbness. Psychiatric:  Anxiety and depression. Allergic/Immunilogical: Sulfur antibiotics  ____________________________________________   PHYSICAL EXAM:  VITAL SIGNS: ED Triage Vitals [11/26/19 0953]  Enc Vitals Group     BP 117/79     Pulse Rate 79     Resp 18     Temp 98.5 F (36.9 C)     Temp Source Oral     SpO2 98 %     Weight 129 lb (58.5 kg)     Height 5\' 3"  (1.6 m)     Head Circumference      Peak Flow      Pain Score 10     Pain Loc      Pain Edu?      Excl. in GC?     Constitutional: Alert and oriented. Well appearing and in no acute distress. Eyes: Conjunctivae are normal. PERRL. EOMI. Head: Atraumatic. Nose: No congestion/rhinnorhea. Mouth/Throat: Mucous membranes are moist.  Oropharynx erythematous. Neck: No stridor.   Hematological/Lymphatic/Immunilogical: No cervical lymphadenopathy. Cardiovascular: Normal rate, regular rhythm. Grossly normal heart sounds.  Good peripheral circulation. Respiratory: Normal respiratory effort.  No retractions. Lungs CTAB. Gastrointestinal: Soft and nontender. No distention. No abdominal bruits. No CVA tenderness. Genitourinary: Deferred Musculoskeletal: No lower extremity tenderness nor edema.  No joint effusions. Neurologic:  Normal speech and language. No gross  focal neurologic deficits are appreciated. No gait instability. Skin:  Skin is warm, dry and intact. No rash noted. Psychiatric: Mood and affect are normal. Speech and behavior are normal.  ____________________________________________   LABS (all labs ordered are listed, but only abnormal results are displayed)  Labs Reviewed  SARS CORONAVIRUS 2 BY RT PCR (HOSPITAL ORDER, PERFORMED IN [facility]) - Abnormal; Notable for the following components:      Result Value   SARS Coronavirus 2 POSITIVE (*)    All other components within normal limits  URINALYSIS, ROUTINE W REFLEX MICROSCOPIC - Abnormal; Notable for the following components:   Color, Urine ORANGE (*)    APPearance HAZY (*)    Specific Gravity, Urine 1.039 (*)    Glucose, UA   (*)    Value: TEST NOT REPORTED DUE TO COLOR INTERFERENCE OF URINE PIGMENT   Hgb urine dipstick   (*)    Value: TEST NOT REPORTED DUE TO COLOR INTERFERENCE  OF URINE PIGMENT   Bilirubin Urine   (*)    Value: TEST NOT REPORTED DUE TO COLOR INTERFERENCE OF URINE PIGMENT   Ketones, ur   (*)    Value: TEST NOT REPORTED DUE TO COLOR INTERFERENCE OF URINE PIGMENT   Protein, ur   (*)    Value: TEST NOT REPORTED DUE TO COLOR INTERFERENCE OF URINE PIGMENT   Nitrite   (*)    Value: TEST NOT REPORTED DUE TO COLOR INTERFERENCE OF URINE PIGMENT   Leukocytes,Ua   (*)    Value: TEST NOT REPORTED DUE TO COLOR INTERFERENCE OF URINE PIGMENT   Bacteria, UA MANY (*)    All other components within normal limits  GROUP A STREP BY PCR   ____________________________________________  EKG   ____________________________________________  RADIOLOGY  ED MD interpretation:    Official radiology report(s): No results found.  ____________________________________________   PROCEDURES  Procedure(s) performed (including Critical Care):  Procedures   ____________________________________________   INITIAL IMPRESSION / ASSESSMENT AND PLAN / ED  COURSE  As part of my medical decision making, I reviewed the following data within the electronic MEDICAL RECORD NUMBER     Patient presents with headache, fatigue, body aches, and cough.  Patient will complain of dysuria and urinary frequency.  Test results are positive for COVID-19 and a UTI.  Patient given discharge care instruction advised to self quarantine for 10 days.  Patient advised follow-up with PCP.  Take medication as directed.    Erin Ellis was evaluated in Emergency Department on 11/26/2019 for the symptoms described in the history of present illness. She was evaluated in the context of the global COVID-19 pandemic, which necessitated consideration that the patient might be at risk for infection with the SARS-CoV-2 virus that causes COVID-19. Institutional protocols and algorithms that pertain to the evaluation of patients at risk for COVID-19 are in a state of rapid change based on information released by regulatory bodies including the CDC and federal and state organizations. These policies and algorithms were followed during the patient's care in the ED.       ____________________________________________   FINAL CLINICAL IMPRESSION(S) / ED DIAGNOSES  Final diagnoses:  COVID-19 virus infection  Lower urinary tract infectious disease     ED Discharge Orders         Ordered    brompheniramine-pseudoephedrine-DM 30-2-10 MG/5ML syrup  4 times daily PRN        11/26/19 1334    ibuprofen (ADVIL) 600 MG tablet  Every 8 hours PRN        11/26/19 1334    nitrofurantoin, macrocrystal-monohydrate, (MACROBID) 100 MG capsule  2 times daily        11/26/19 1334    phenazopyridine (PYRIDIUM) 200 MG tablet  3 times daily PRN        11/26/19 1334           Note:  This document was prepared using Dragon voice recognition software and may include unintentional dictation errors.    Joni Reining, PA-C 11/26/19 1338    Dionne Bucy, MD 11/29/19 [phone]

## 2019-11-26 NOTE — Discharge Instructions (Signed)
Follow discharge care instruction take medication as directed.  Self quarantine for the next 10 days.

## 2020-10-19 DIAGNOSIS — M7918 Myalgia, other site: Secondary | ICD-10-CM | POA: Diagnosis not present

## 2020-10-19 DIAGNOSIS — G894 Chronic pain syndrome: Secondary | ICD-10-CM | POA: Diagnosis not present

## 2020-10-19 DIAGNOSIS — M792 Neuralgia and neuritis, unspecified: Secondary | ICD-10-CM | POA: Diagnosis not present

## 2021-03-14 IMAGING — MR MR CERVICAL SPINE W/O CM
5 series · 40 of 48 positions shown · non-contrast
Comparison: None.

CLINICAL DATA: Low back pain last week with progressive extension
superiorly into the neck on the left with inability to raise the
left arm.

EXAM:
MRI CERVICAL SPINE WITHOUT CONTRAST
TECHNIQUE: Multiplanar, multisequence MR imaging of the cervical spine was
performed. No intravenous contrast was administered.

[Series 9: T2 · sagittal · 3.0mm · 0.62mm/px · 6 of 15 slices shown (1 of 2)]
[im 1/15]
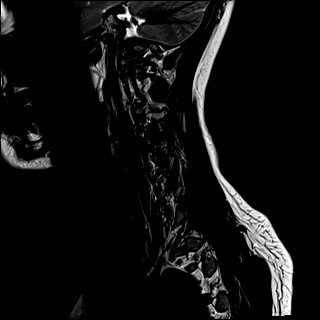
[im 3/15]
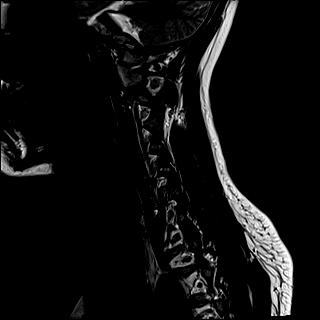
[im 6/15]
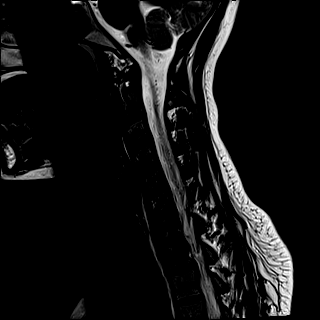
[im 9/15]
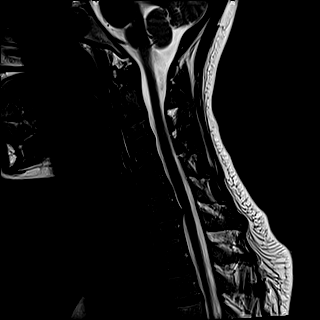
[im 12/15]
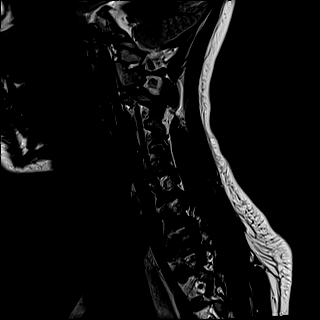
[im 15/15]
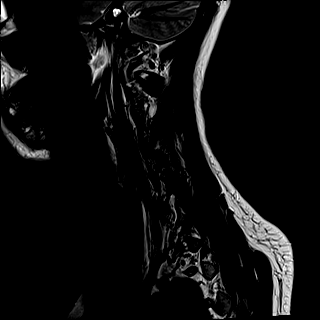

[Series 10: FLAIR · sagittal · 3.0mm · 0.78mm/px · 7 of 15 slices shown]
[im 1/15]
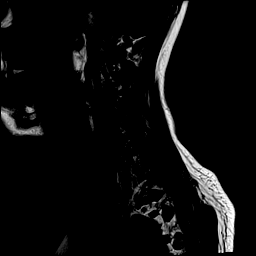
[im 3/15]
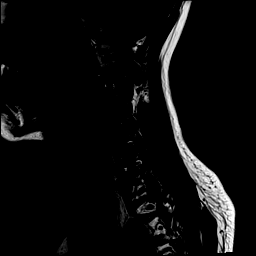
[im 5/15]
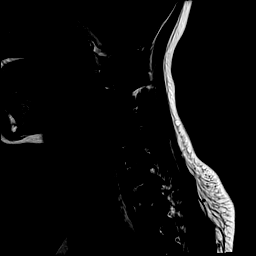
[im 8/15]
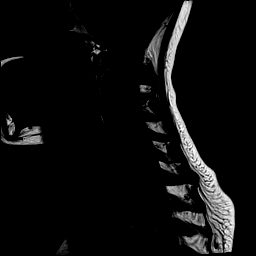
[im 10/15]
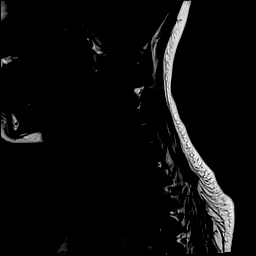
[im 12/15]
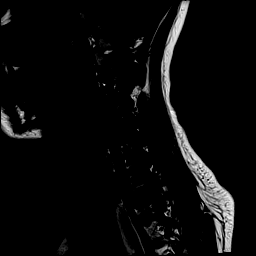
[im 15/15]
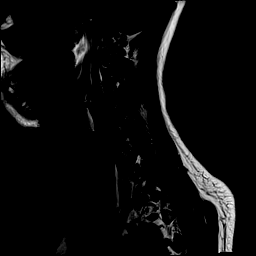

[Series 11: STIR · sagittal · 3.0mm · 0.62mm/px · 7 of 15 slices shown]
[im 1/15]
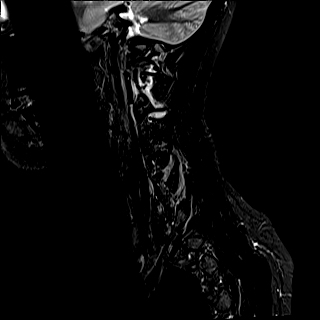
[im 3/15]
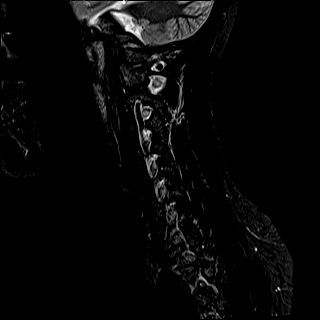
[im 5/15]
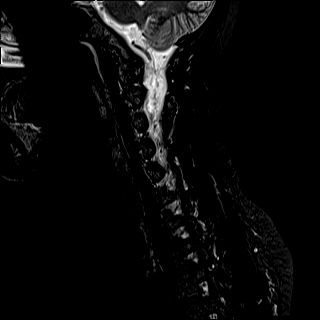
[im 8/15]
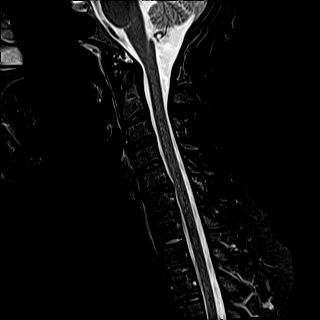
[im 10/15]
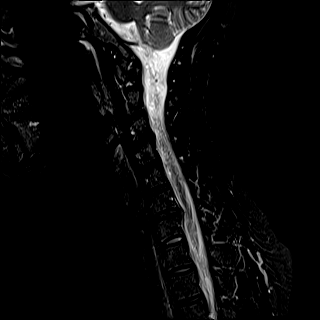
[im 12/15]
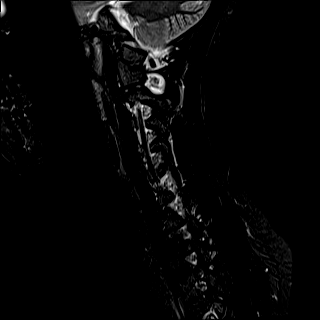
[im 15/15]
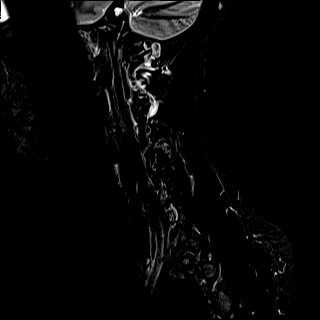

[Series 12: T2 · axial · 3.0mm · 0.62mm/px · z∈[-24,+74]mm · 12 of 30 slices shown (2 of 2)]
[im 1/30]
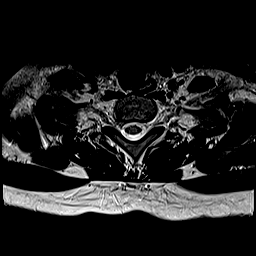
[im 3/30]
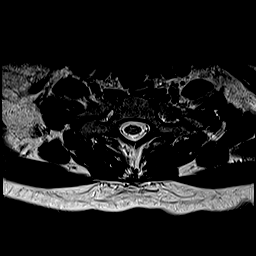
[im 5/30]
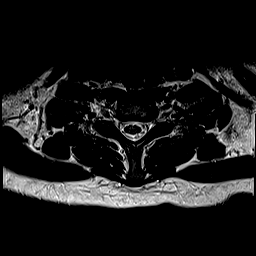
[im 7/30]
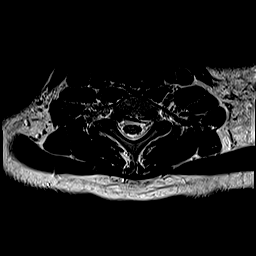
[im 9/30]
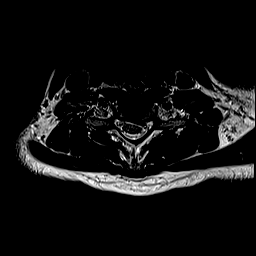
[im 12/30]
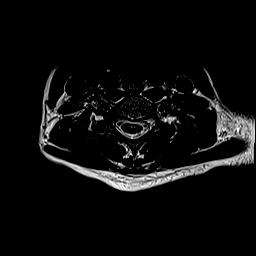
[im 14/30]
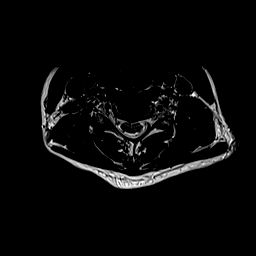
[im 16/30]
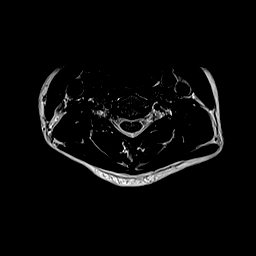
[im 18/30]
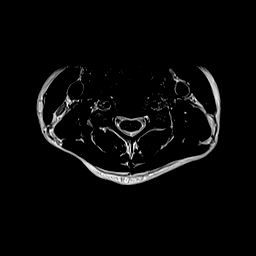
[im 21/30]
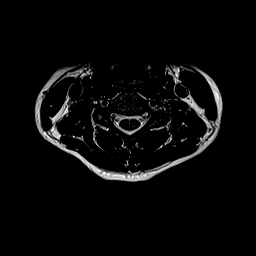
[im 25/30]
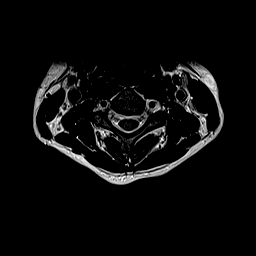
[im 30/30]
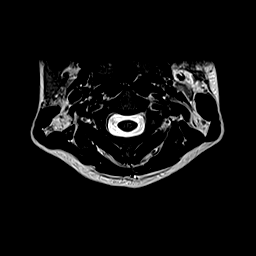

[Series 13: ax mpgr · axial · 3.0mm · 0.35mm/px · z∈[-27,+71]mm · 8 of 30 slices shown]
[im 1/30]
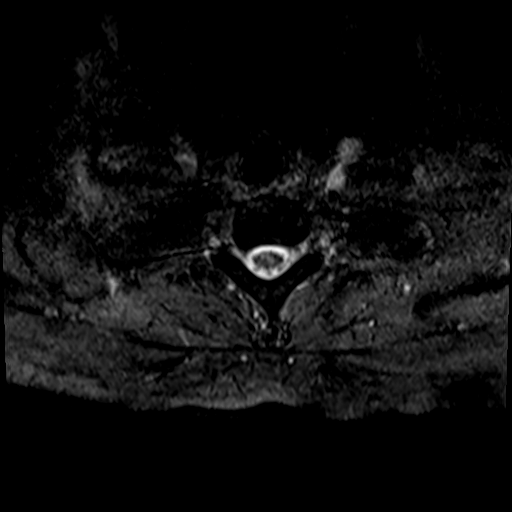
[im 5/30]
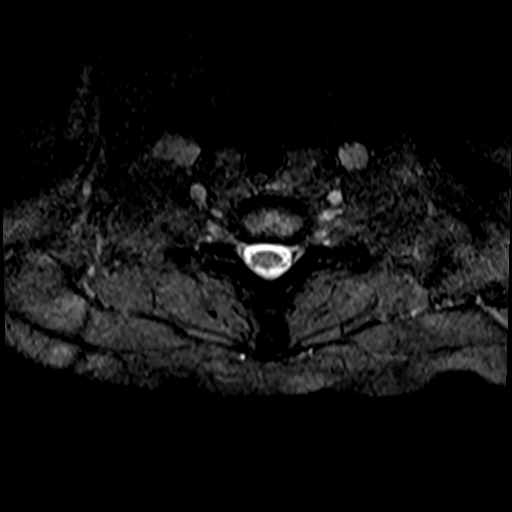
[im 9/30]
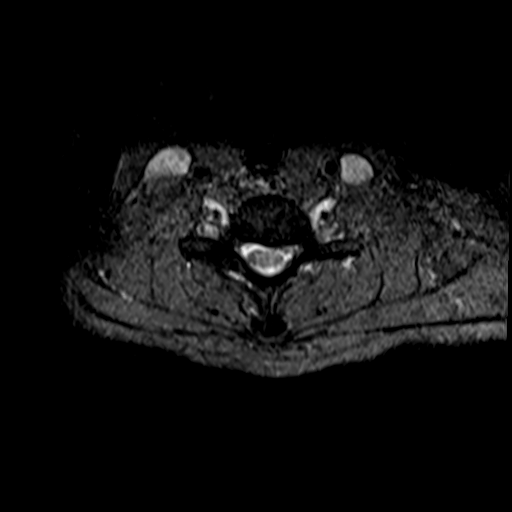
[im 14/30]
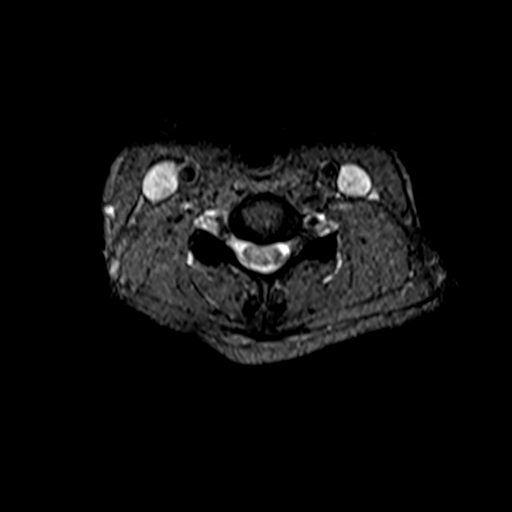
[im 16/30]
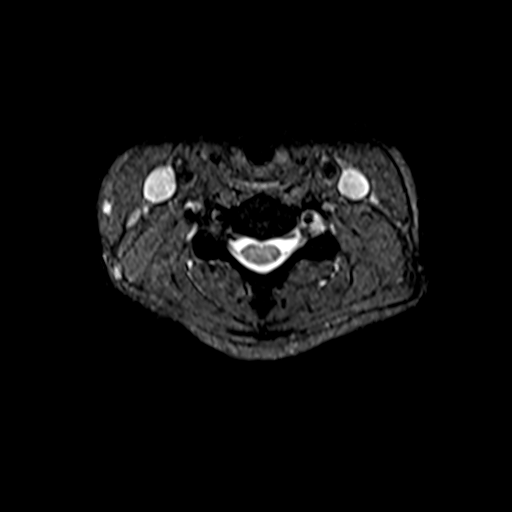
[im 21/30]
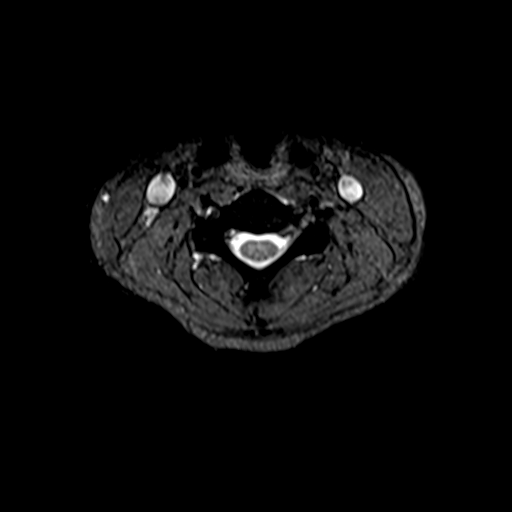
[im 25/30]
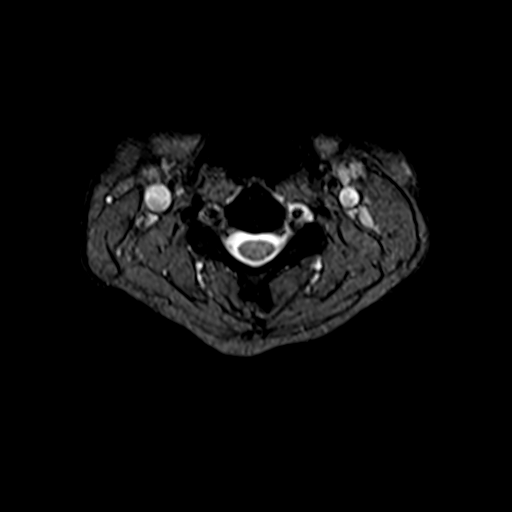
[im 30/30]
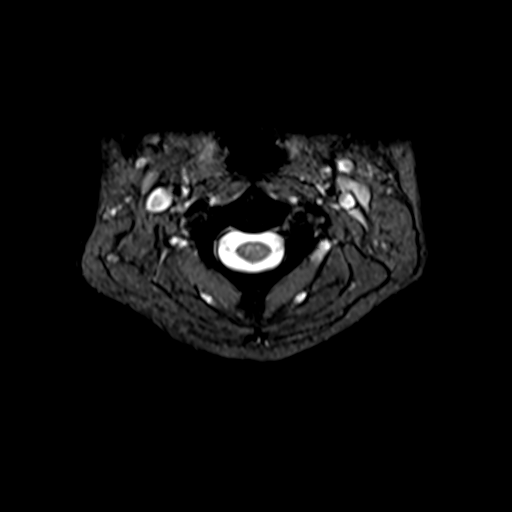

[40 of 48 positions shown; findings below may reference images not displayed]

FINDINGS: Alignment: Cervical spine straightening. No listhesis.

Vertebrae: No fracture, suspicious osseous lesion, or significant
marrow edema.

Cord: Normal signal.

Posterior Fossa, vertebral arteries, paraspinal tissues:
Subcentimeter T2 hyperintense nodules in the right thyroid lobe for
which no follow-up is recommended. Preserved vertebral artery flow
voids. No cerebellar tonsillar ectopia.

Disc levels:

C2-3: Negative.

C3-4: Minimal left uncovertebral spurring without stenosis.

C4-5: Negative.

C5-6: A small central disc protrusion results in borderline to mild
spinal stenosis with slight ventral cord flattening. Patent neural
foramina.

C6-7: A small left paracentral disc protrusion results in mild
spinal stenosis with slight left ventral cord flattening. Patent
neural foramina.

C7-T1: Negative.

T1-2: Only imaged sagittally. A right foraminal disc osteophyte
complex results in mild right neural foraminal stenosis. No spinal
stenosis.
IMPRESSION: Small disc protrusions at C5-6 and C6-7 resulting in mild spinal
stenosis, more notable at C6-7.

## 2021-05-29 DIAGNOSIS — G894 Chronic pain syndrome: Secondary | ICD-10-CM | POA: Diagnosis not present

## 2021-05-29 DIAGNOSIS — M792 Neuralgia and neuritis, unspecified: Secondary | ICD-10-CM | POA: Diagnosis not present

## 2021-05-29 DIAGNOSIS — M7918 Myalgia, other site: Secondary | ICD-10-CM | POA: Diagnosis not present

## 2021-07-23 DIAGNOSIS — Z01419 Encounter for gynecological examination (general) (routine) without abnormal findings: Secondary | ICD-10-CM | POA: Diagnosis not present

## 2021-07-23 DIAGNOSIS — F3160 Bipolar disorder, current episode mixed, unspecified: Secondary | ICD-10-CM | POA: Diagnosis not present

## 2021-07-23 DIAGNOSIS — I7300 Raynaud's syndrome without gangrene: Secondary | ICD-10-CM | POA: Diagnosis not present

## 2021-07-23 DIAGNOSIS — R682 Dry mouth, unspecified: Secondary | ICD-10-CM | POA: Diagnosis not present

## 2021-07-23 DIAGNOSIS — Z1329 Encounter for screening for other suspected endocrine disorder: Secondary | ICD-10-CM | POA: Diagnosis not present

## 2021-07-23 DIAGNOSIS — Z131 Encounter for screening for diabetes mellitus: Secondary | ICD-10-CM | POA: Diagnosis not present

## 2021-07-23 DIAGNOSIS — R768 Other specified abnormal immunological findings in serum: Secondary | ICD-10-CM | POA: Diagnosis not present

## 2021-07-23 DIAGNOSIS — M2550 Pain in unspecified joint: Secondary | ICD-10-CM | POA: Diagnosis not present

## 2021-07-23 DIAGNOSIS — Z0000 Encounter for general adult medical examination without abnormal findings: Secondary | ICD-10-CM | POA: Diagnosis not present

## 2021-07-23 DIAGNOSIS — H04123 Dry eye syndrome of bilateral lacrimal glands: Secondary | ICD-10-CM | POA: Diagnosis not present

## 2021-07-23 DIAGNOSIS — G8929 Other chronic pain: Secondary | ICD-10-CM | POA: Diagnosis not present

## 2021-07-23 DIAGNOSIS — E559 Vitamin D deficiency, unspecified: Secondary | ICD-10-CM | POA: Diagnosis not present

## 2021-07-23 DIAGNOSIS — G939 Disorder of brain, unspecified: Secondary | ICD-10-CM | POA: Diagnosis not present

## 2021-07-23 DIAGNOSIS — R21 Rash and other nonspecific skin eruption: Secondary | ICD-10-CM | POA: Diagnosis not present

## 2021-07-23 DIAGNOSIS — Z13220 Encounter for screening for lipoid disorders: Secondary | ICD-10-CM | POA: Diagnosis not present

## 2021-08-01 DIAGNOSIS — H02886 Meibomian gland dysfunction of left eye, unspecified eyelid: Secondary | ICD-10-CM | POA: Diagnosis not present

## 2021-08-01 DIAGNOSIS — H02883 Meibomian gland dysfunction of right eye, unspecified eyelid: Secondary | ICD-10-CM | POA: Diagnosis not present

## 2021-08-01 DIAGNOSIS — H5213 Myopia, bilateral: Secondary | ICD-10-CM | POA: Diagnosis not present

## 2021-08-01 DIAGNOSIS — H18832 Recurrent erosion of cornea, left eye: Secondary | ICD-10-CM | POA: Diagnosis not present

## 2021-08-09 DIAGNOSIS — M545 Low back pain, unspecified: Secondary | ICD-10-CM | POA: Diagnosis not present

## 2021-08-09 DIAGNOSIS — R109 Unspecified abdominal pain: Secondary | ICD-10-CM | POA: Diagnosis not present

## 2021-08-28 DIAGNOSIS — K8020 Calculus of gallbladder without cholecystitis without obstruction: Secondary | ICD-10-CM | POA: Diagnosis not present

## 2021-08-28 DIAGNOSIS — K829 Disease of gallbladder, unspecified: Secondary | ICD-10-CM | POA: Diagnosis not present

## 2021-08-28 DIAGNOSIS — N939 Abnormal uterine and vaginal bleeding, unspecified: Secondary | ICD-10-CM | POA: Diagnosis not present

## 2021-09-17 DIAGNOSIS — K8050 Calculus of bile duct without cholangitis or cholecystitis without obstruction: Secondary | ICD-10-CM | POA: Diagnosis not present

## 2021-10-14 DIAGNOSIS — R0789 Other chest pain: Secondary | ICD-10-CM | POA: Diagnosis not present

## 2021-10-14 DIAGNOSIS — R079 Chest pain, unspecified: Secondary | ICD-10-CM | POA: Diagnosis not present

## 2021-10-15 DIAGNOSIS — M792 Neuralgia and neuritis, unspecified: Secondary | ICD-10-CM | POA: Diagnosis not present

## 2021-10-15 DIAGNOSIS — M7918 Myalgia, other site: Secondary | ICD-10-CM | POA: Diagnosis not present

## 2021-10-15 DIAGNOSIS — M329 Systemic lupus erythematosus, unspecified: Secondary | ICD-10-CM | POA: Diagnosis not present

## 2021-10-15 DIAGNOSIS — G8929 Other chronic pain: Secondary | ICD-10-CM | POA: Diagnosis not present

## 2021-10-26 DIAGNOSIS — K8020 Calculus of gallbladder without cholecystitis without obstruction: Secondary | ICD-10-CM | POA: Diagnosis not present

## 2021-11-15 DIAGNOSIS — H0014 Chalazion left upper eyelid: Secondary | ICD-10-CM | POA: Diagnosis not present

## 2021-12-12 DIAGNOSIS — H00011 Hordeolum externum right upper eyelid: Secondary | ICD-10-CM | POA: Diagnosis not present

## 2021-12-12 DIAGNOSIS — R110 Nausea: Secondary | ICD-10-CM | POA: Diagnosis not present

## 2021-12-12 DIAGNOSIS — L659 Nonscarring hair loss, unspecified: Secondary | ICD-10-CM | POA: Diagnosis not present

## 2022-01-09 DIAGNOSIS — Z1159 Encounter for screening for other viral diseases: Secondary | ICD-10-CM | POA: Diagnosis not present

## 2022-01-09 DIAGNOSIS — R52 Pain, unspecified: Secondary | ICD-10-CM | POA: Diagnosis not present

## 2022-01-09 DIAGNOSIS — U071 COVID-19: Secondary | ICD-10-CM | POA: Diagnosis not present

## 2022-01-09 DIAGNOSIS — Z1152 Encounter for screening for COVID-19: Secondary | ICD-10-CM | POA: Diagnosis not present

## 2022-01-09 DIAGNOSIS — R0981 Nasal congestion: Secondary | ICD-10-CM | POA: Diagnosis not present

## 2022-01-09 DIAGNOSIS — R059 Cough, unspecified: Secondary | ICD-10-CM | POA: Diagnosis not present

## 2022-03-13 DIAGNOSIS — F410 Panic disorder [episodic paroxysmal anxiety] without agoraphobia: Secondary | ICD-10-CM | POA: Diagnosis not present

## 2022-03-13 DIAGNOSIS — F4310 Post-traumatic stress disorder, unspecified: Secondary | ICD-10-CM | POA: Diagnosis not present

## 2022-03-13 DIAGNOSIS — F411 Generalized anxiety disorder: Secondary | ICD-10-CM | POA: Diagnosis not present

## 2022-04-03 DIAGNOSIS — R112 Nausea with vomiting, unspecified: Secondary | ICD-10-CM | POA: Diagnosis not present

## 2022-04-03 DIAGNOSIS — R5383 Other fatigue: Secondary | ICD-10-CM | POA: Diagnosis not present

## 2022-04-03 DIAGNOSIS — R61 Generalized hyperhidrosis: Secondary | ICD-10-CM | POA: Diagnosis not present

## 2022-04-03 DIAGNOSIS — R630 Anorexia: Secondary | ICD-10-CM | POA: Diagnosis not present

## 2022-04-03 DIAGNOSIS — R109 Unspecified abdominal pain: Secondary | ICD-10-CM | POA: Diagnosis not present

## 2022-04-03 DIAGNOSIS — R6889 Other general symptoms and signs: Secondary | ICD-10-CM | POA: Diagnosis not present

## 2022-04-08 DIAGNOSIS — F3132 Bipolar disorder, current episode depressed, moderate: Secondary | ICD-10-CM | POA: Diagnosis not present

## 2022-04-08 DIAGNOSIS — Z8659 Personal history of other mental and behavioral disorders: Secondary | ICD-10-CM | POA: Diagnosis not present

## 2022-04-08 DIAGNOSIS — F411 Generalized anxiety disorder: Secondary | ICD-10-CM | POA: Diagnosis not present

## 2022-04-08 DIAGNOSIS — F4310 Post-traumatic stress disorder, unspecified: Secondary | ICD-10-CM | POA: Diagnosis not present

## 2022-04-08 DIAGNOSIS — F410 Panic disorder [episodic paroxysmal anxiety] without agoraphobia: Secondary | ICD-10-CM | POA: Diagnosis not present

## 2022-05-08 DIAGNOSIS — R630 Anorexia: Secondary | ICD-10-CM | POA: Diagnosis not present

## 2022-05-08 DIAGNOSIS — E0590 Thyrotoxicosis, unspecified without thyrotoxic crisis or storm: Secondary | ICD-10-CM | POA: Diagnosis not present

## 2022-05-08 DIAGNOSIS — R109 Unspecified abdominal pain: Secondary | ICD-10-CM | POA: Diagnosis not present

## 2022-05-08 DIAGNOSIS — R112 Nausea with vomiting, unspecified: Secondary | ICD-10-CM | POA: Diagnosis not present

## 2022-05-11 DIAGNOSIS — R1013 Epigastric pain: Secondary | ICD-10-CM | POA: Diagnosis not present

## 2022-05-11 DIAGNOSIS — K861 Other chronic pancreatitis: Secondary | ICD-10-CM | POA: Diagnosis not present

## 2022-05-11 DIAGNOSIS — H5713 Ocular pain, bilateral: Secondary | ICD-10-CM | POA: Diagnosis not present

## 2022-05-12 DIAGNOSIS — H5713 Ocular pain, bilateral: Secondary | ICD-10-CM | POA: Diagnosis not present

## 2022-05-15 DIAGNOSIS — H0014 Chalazion left upper eyelid: Secondary | ICD-10-CM | POA: Diagnosis not present

## 2022-05-15 DIAGNOSIS — H0288B Meibomian gland dysfunction left eye, upper and lower eyelids: Secondary | ICD-10-CM | POA: Diagnosis not present

## 2022-05-15 DIAGNOSIS — H0288A Meibomian gland dysfunction right eye, upper and lower eyelids: Secondary | ICD-10-CM | POA: Diagnosis not present

## 2022-05-15 DIAGNOSIS — H43393 Other vitreous opacities, bilateral: Secondary | ICD-10-CM | POA: Diagnosis not present

## 2022-05-15 DIAGNOSIS — H04123 Dry eye syndrome of bilateral lacrimal glands: Secondary | ICD-10-CM | POA: Diagnosis not present

## 2022-07-18 DIAGNOSIS — D229 Melanocytic nevi, unspecified: Secondary | ICD-10-CM | POA: Diagnosis not present

## 2022-07-18 DIAGNOSIS — Z1283 Encounter for screening for malignant neoplasm of skin: Secondary | ICD-10-CM | POA: Diagnosis not present

## 2022-07-22 DIAGNOSIS — F3132 Bipolar disorder, current episode depressed, moderate: Secondary | ICD-10-CM | POA: Diagnosis not present

## 2022-07-22 DIAGNOSIS — F411 Generalized anxiety disorder: Secondary | ICD-10-CM | POA: Diagnosis not present

## 2022-07-22 DIAGNOSIS — F4310 Post-traumatic stress disorder, unspecified: Secondary | ICD-10-CM | POA: Diagnosis not present

## 2022-07-22 DIAGNOSIS — F410 Panic disorder [episodic paroxysmal anxiety] without agoraphobia: Secondary | ICD-10-CM | POA: Diagnosis not present

## 2022-08-14 DIAGNOSIS — F988 Other specified behavioral and emotional disorders with onset usually occurring in childhood and adolescence: Secondary | ICD-10-CM | POA: Diagnosis not present

## 2022-08-14 DIAGNOSIS — R413 Other amnesia: Secondary | ICD-10-CM | POA: Diagnosis not present

## 2022-08-14 DIAGNOSIS — R5383 Other fatigue: Secondary | ICD-10-CM | POA: Diagnosis not present

## 2022-08-14 DIAGNOSIS — R41840 Attention and concentration deficit: Secondary | ICD-10-CM | POA: Diagnosis not present

## 2022-08-14 DIAGNOSIS — E538 Deficiency of other specified B group vitamins: Secondary | ICD-10-CM | POA: Diagnosis not present

## 2022-08-14 DIAGNOSIS — Z9189 Other specified personal risk factors, not elsewhere classified: Secondary | ICD-10-CM | POA: Diagnosis not present

## 2022-08-14 DIAGNOSIS — E0590 Thyrotoxicosis, unspecified without thyrotoxic crisis or storm: Secondary | ICD-10-CM | POA: Diagnosis not present

## 2022-08-14 DIAGNOSIS — E559 Vitamin D deficiency, unspecified: Secondary | ICD-10-CM | POA: Diagnosis not present

## 2022-08-14 DIAGNOSIS — M7989 Other specified soft tissue disorders: Secondary | ICD-10-CM | POA: Diagnosis not present

## 2022-08-14 DIAGNOSIS — R4584 Anhedonia: Secondary | ICD-10-CM | POA: Diagnosis not present

## 2022-08-28 DIAGNOSIS — H109 Unspecified conjunctivitis: Secondary | ICD-10-CM | POA: Diagnosis not present

## 2022-10-09 DIAGNOSIS — N390 Urinary tract infection, site not specified: Secondary | ICD-10-CM | POA: Diagnosis not present

## 2022-10-14 DIAGNOSIS — N390 Urinary tract infection, site not specified: Secondary | ICD-10-CM | POA: Diagnosis not present

## 2022-10-24 DIAGNOSIS — F3132 Bipolar disorder, current episode depressed, moderate: Secondary | ICD-10-CM | POA: Diagnosis not present

## 2022-10-24 DIAGNOSIS — F4310 Post-traumatic stress disorder, unspecified: Secondary | ICD-10-CM | POA: Diagnosis not present

## 2022-10-24 DIAGNOSIS — F411 Generalized anxiety disorder: Secondary | ICD-10-CM | POA: Diagnosis not present

## 2022-10-24 DIAGNOSIS — F410 Panic disorder [episodic paroxysmal anxiety] without agoraphobia: Secondary | ICD-10-CM | POA: Diagnosis not present

## 2022-11-05 DIAGNOSIS — G243 Spasmodic torticollis: Secondary | ICD-10-CM | POA: Diagnosis not present

## 2022-11-07 DIAGNOSIS — K8050 Calculus of bile duct without cholangitis or cholecystitis without obstruction: Secondary | ICD-10-CM | POA: Diagnosis not present

## 2022-11-07 DIAGNOSIS — R109 Unspecified abdominal pain: Secondary | ICD-10-CM | POA: Diagnosis not present

## 2022-11-07 DIAGNOSIS — F3132 Bipolar disorder, current episode depressed, moderate: Secondary | ICD-10-CM | POA: Diagnosis not present

## 2022-11-07 DIAGNOSIS — R630 Anorexia: Secondary | ICD-10-CM | POA: Diagnosis not present

## 2022-11-07 DIAGNOSIS — E0590 Thyrotoxicosis, unspecified without thyrotoxic crisis or storm: Secondary | ICD-10-CM | POA: Diagnosis not present

## 2022-11-07 DIAGNOSIS — K861 Other chronic pancreatitis: Secondary | ICD-10-CM | POA: Diagnosis not present

## 2022-11-07 DIAGNOSIS — E079 Disorder of thyroid, unspecified: Secondary | ICD-10-CM | POA: Diagnosis not present

## 2022-11-07 DIAGNOSIS — M329 Systemic lupus erythematosus, unspecified: Secondary | ICD-10-CM | POA: Diagnosis not present

## 2022-11-07 DIAGNOSIS — F3160 Bipolar disorder, current episode mixed, unspecified: Secondary | ICD-10-CM | POA: Diagnosis not present

## 2022-11-07 DIAGNOSIS — N390 Urinary tract infection, site not specified: Secondary | ICD-10-CM | POA: Diagnosis not present

## 2022-11-07 DIAGNOSIS — R112 Nausea with vomiting, unspecified: Secondary | ICD-10-CM | POA: Diagnosis not present

## 2022-11-11 DIAGNOSIS — R109 Unspecified abdominal pain: Secondary | ICD-10-CM | POA: Diagnosis not present

## 2022-11-11 DIAGNOSIS — R112 Nausea with vomiting, unspecified: Secondary | ICD-10-CM | POA: Diagnosis not present

## 2022-11-11 DIAGNOSIS — R630 Anorexia: Secondary | ICD-10-CM | POA: Diagnosis not present
